# Patient Record
Sex: Female | Born: 1975
Health system: Southern US, Community
[De-identification: ages and names within clinical notes are randomized; demographics above are authoritative.]

## PROBLEM LIST (undated history)

## (undated) DIAGNOSIS — A749 Chlamydial infection, unspecified: Secondary | ICD-10-CM

## (undated) HISTORY — DX: Chlamydial infection, unspecified: A74.9

---

## 2006-08-01 ENCOUNTER — Inpatient Hospital Stay (HOSPITAL_COMMUNITY): Admission: AD | Admit: 2006-08-01 | Discharge: 2006-08-01 | Payer: Self-pay | Admitting: Obstetrics and Gynecology

## 2006-08-03 ENCOUNTER — Inpatient Hospital Stay (HOSPITAL_COMMUNITY): Admission: AD | Admit: 2006-08-03 | Discharge: 2006-08-05 | Payer: Self-pay | Admitting: Obstetrics and Gynecology

## 2010-03-29 DIAGNOSIS — A749 Chlamydial infection, unspecified: Secondary | ICD-10-CM

## 2010-03-29 HISTORY — DX: Chlamydial infection, unspecified: A74.9

## 2010-05-08 ENCOUNTER — Other Ambulatory Visit: Admission: RE | Admit: 2010-05-08 | Discharge: 2010-05-08 | Payer: Self-pay | Admitting: Gynecology

## 2010-05-08 ENCOUNTER — Ambulatory Visit: Payer: Self-pay | Admitting: Women's Health

## 2010-05-20 ENCOUNTER — Ambulatory Visit: Payer: Self-pay | Admitting: Women's Health

## 2011-05-12 ENCOUNTER — Encounter: Payer: Self-pay | Admitting: Women's Health

## 2011-05-29 ENCOUNTER — Other Ambulatory Visit (HOSPITAL_COMMUNITY)
Admission: RE | Admit: 2011-05-29 | Discharge: 2011-05-29 | Disposition: A | Discharge status: Home / Self Care | Payer: PRIVATE HEALTH INSURANCE | Source: Ambulatory Visit | Attending: Gynecology | Admitting: Gynecology

## 2011-05-29 ENCOUNTER — Encounter (INDEPENDENT_AMBULATORY_CARE_PROVIDER_SITE_OTHER): Payer: PRIVATE HEALTH INSURANCE | Admitting: Women's Health

## 2011-05-29 ENCOUNTER — Other Ambulatory Visit: Payer: Self-pay | Admitting: Women's Health

## 2011-05-29 DIAGNOSIS — Z833 Family history of diabetes mellitus: Secondary | ICD-10-CM

## 2011-05-29 DIAGNOSIS — Z124 Encounter for screening for malignant neoplasm of cervix: Secondary | ICD-10-CM | POA: Insufficient documentation

## 2011-05-29 DIAGNOSIS — Z01419 Encounter for gynecological examination (general) (routine) without abnormal findings: Secondary | ICD-10-CM

## 2012-05-31 ENCOUNTER — Encounter: Payer: Self-pay | Admitting: Women's Health

## 2012-05-31 ENCOUNTER — Ambulatory Visit (INDEPENDENT_AMBULATORY_CARE_PROVIDER_SITE_OTHER): Payer: PRIVATE HEALTH INSURANCE | Admitting: Women's Health

## 2012-05-31 VITALS — BP 100/70 | Ht 62.75 in | Wt 110.0 lb

## 2012-05-31 DIAGNOSIS — Z01419 Encounter for gynecological examination (general) (routine) without abnormal findings: Secondary | ICD-10-CM

## 2012-05-31 NOTE — Progress Notes (Signed)
Teresa Wheeler 22-Sep-1963 [id number]    History:    The patient presents for annual exam.  Monthly 3 day cycle not sexually active/ widow. History of normal Paps.  Past medical history, past surgical history, family history and social history were all reviewed and documented in the EPIC chart. Daughter [name] ,6 doing well. 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 History of positive chlamydia in 2011 while married.   ROS:  A  ROS was performed and pertinent positives and negatives are included in the history.  Exam:  Filed Vitals:   05/31/12 0928  BP: 100/70    General appearance:  Normal Head/Neck:  Normal, without cervical or supraclavicular adenopathy. Thyroid:  Symmetrical, normal in size, without palpable masses or nodularity. Respiratory  Effort:  Normal  Auscultation:  Clear without wheezing or rhonchi Cardiovascular  Auscultation:  Regular rate, without rubs, murmurs or gallops  Edema/varicosities:  Not grossly evident Abdominal  Soft,nontender, without masses, guarding or rebound.  Liver/spleen:  No organomegaly noted  Hernia:  None appreciated  Skin  Inspection:  Grossly normal  Palpation:  Grossly normal Neurologic/psychiatric  Orientation:  Normal with appropriate conversation.  Mood/affect:  Normal  Genitourinary    Breasts: Examined lying and sitting.     Right: Without masses, retractions, discharge or axillary adenopathy.     Left: Without masses, retractions, discharge or axillary adenopathy.   Inguinal/mons:  Normal without inguinal adenopathy  External genitalia:  Normal  BUS/Urethra/Skene's glands:  Normal  Bladder:  Normal  Vagina:  Normal  Cervix:  Normal  Uterus:   normal in size, shape and contour.  Midline and mobile  Adnexa/parametria:     Rt: Without masses or tenderness.   Lt: Without masses or tenderness.  Anus and perineum: Normal  Digital rectal exam: Normal sphincter tone without palpated masses or tenderness  Assessment/Plan:  36 y.o. WAF  G1P1 for annual exam with no complaints.  Normal GYN exam  Plan: CBC, UA. No Pap done, history of normal Paps, new guidelines and Pap screening recommendations reviewed. SBE's, exercise, calcium rich diet, MVI daily encouraged. Has a healthy lifestyle and will continue. Encouraged condoms if becomes sexually active or return to office for contraception.      Harrington Challenger [facility], 12:16 PM 05/31/2012

## 2012-05-31 NOTE — Patient Instructions (Signed)

## 2012-06-01 LAB — URINALYSIS W MICROSCOPIC + REFLEX CULTURE
Bacteria, UA: NONE SEEN
Casts: NONE SEEN
Glucose, UA: NEGATIVE mg/dL
Hgb urine dipstick: NEGATIVE
Ketones, ur: NEGATIVE mg/dL
Nitrite: NEGATIVE
pH: 6 (ref 5.0–8.0)

## 2012-06-01 LAB — CBC WITH DIFFERENTIAL/PLATELET
Eosinophils Absolute: 0.2 10*3/uL (ref 0.0–0.7)
Eosinophils Relative: 3 % (ref 0–5)
Lymphs Abs: 2.4 10*3/uL (ref 0.7–4.0)
MCH: 29.6 pg (ref 26.0–34.0)
MCHC: 33.5 g/dL (ref 30.0–36.0)
MCV: 88.5 fL (ref 78.0–100.0)
Platelets: 363 10*3/uL (ref 150–400)
RBC: 4.52 MIL/uL (ref 3.87–5.11)

## 2013-06-02 ENCOUNTER — Encounter: Payer: Self-pay | Admitting: Women's Health

## 2013-06-02 ENCOUNTER — Other Ambulatory Visit (HOSPITAL_COMMUNITY)
Admission: RE | Admit: 2013-06-02 | Discharge: 2013-06-02 | Disposition: A | Discharge status: Home / Self Care | Payer: PRIVATE HEALTH INSURANCE | Source: Ambulatory Visit | Attending: Gynecology | Admitting: Gynecology

## 2013-06-02 ENCOUNTER — Ambulatory Visit (INDEPENDENT_AMBULATORY_CARE_PROVIDER_SITE_OTHER): Payer: BC Managed Care – PPO | Admitting: Women's Health

## 2013-06-02 VITALS — BP 112/68 | Ht 63.0 in | Wt 107.0 lb

## 2013-06-02 DIAGNOSIS — Z01419 Encounter for gynecological examination (general) (routine) without abnormal findings: Secondary | ICD-10-CM | POA: Insufficient documentation

## 2013-06-02 DIAGNOSIS — N841 Polyp of cervix uteri: Secondary | ICD-10-CM

## 2013-06-02 NOTE — Patient Instructions (Addendum)
Vit D 1000 daily  (Nature made vitamin) Health Maintenance, Females A healthy lifestyle and preventative care can promote health and wellness.  Maintain regular health, dental, and eye exams.  Eat a healthy diet. Foods like vegetables, fruits, whole grains, low-fat dairy products, and lean protein foods contain the nutrients you need without too many calories. Decrease your intake of foods high in solid fats, added sugars, and salt. Get information about a proper diet from your caregiver, if necessary.  Regular physical exercise is one of the most important things you can do for your health. Most adults should get at least 150 minutes of moderate-intensity exercise (any activity that increases your heart rate and causes you to sweat) each week. In addition, most adults need muscle-strengthening exercises on 2 or more days a week.   Maintain a healthy weight. The body mass index (BMI) is a screening tool to identify possible weight problems. It provides an estimate of body fat based on height and weight. Your caregiver can help determine your BMI, and can help you achieve or maintain a healthy weight. For adults 20 years and older:  A BMI below 18.5 is considered underweight.  A BMI of 18.5 to 24.9 is normal.  A BMI of 25 to 29.9 is considered overweight.  A BMI of 30 and above is considered obese.  Maintain normal blood lipids and cholesterol by exercising and minimizing your intake of saturated fat. Eat a balanced diet with plenty of fruits and vegetables. Blood tests for lipids and cholesterol should begin at age 67 and be repeated every 5 years. If your lipid or cholesterol levels are high, you are over 50, or you are a high risk for heart disease, you may need your cholesterol levels checked more frequently.Ongoing high lipid and cholesterol levels should be treated with medicines if diet and exercise are not effective.  If you smoke, find out from your caregiver how to quit. If you do  not use tobacco, do not start.  If you are pregnant, do not drink alcohol. If you are breastfeeding, be very cautious about drinking alcohol. If you are not pregnant and choose to drink alcohol, do not exceed 1 drink per day. One drink is considered to be 12 ounces (355 mL) of beer, 5 ounces (148 mL) of wine, or 1.5 ounces (44 mL) of liquor.  Avoid use of street drugs. Do not share needles with anyone. Ask for help if you need support or instructions about stopping the use of drugs.  High blood pressure causes heart disease and increases the risk of stroke. Blood pressure should be checked at least every 1 to 2 years. Ongoing high blood pressure should be treated with medicines, if weight loss and exercise are not effective.  If you are 75 to 38 years old, ask your caregiver if you should take aspirin to prevent strokes.  Diabetes screening involves taking a blood sample to check your fasting blood sugar level. This should be done once every 3 years, after age 20, if you are within normal weight and without risk factors for diabetes. Testing should be considered at a younger age or be carried out more frequently if you are overweight and have at least 1 risk factor for diabetes.  Breast cancer screening is essential preventative care for women. You should practice "breast self-awareness." This means understanding the normal appearance and feel of your breasts and may include breast self-examination. Any changes detected, no matter how small, should be reported to a caregiver.  Women in their 1s and 30s should have a clinical breast exam (CBE) by a caregiver as part of a regular health exam every 1 to 3 years. After age 13, women should have a CBE every year. Starting at age 28, women should consider having a mammogram (breast X-ray) every year. Women who have a family history of breast cancer should talk to their caregiver about genetic screening. Women at a high risk of breast cancer should talk to  their caregiver about having an MRI and a mammogram every year.  The Pap test is a screening test for cervical cancer. Women should have a Pap test starting at age 52. Between ages 47 and 16, Pap tests should be repeated every 2 years. Beginning at age 60, you should have a Pap test every 3 years as long as the past 3 Pap tests have been normal. If you had a hysterectomy for a problem that was not cancer or a condition that could lead to cancer, then you no longer need Pap tests. If you are between ages 12 and 21, and you have had normal Pap tests going back 10 years, you no longer need Pap tests. If you have had past treatment for cervical cancer or a condition that could lead to cancer, you need Pap tests and screening for cancer for at least 20 years after your treatment. If Pap tests have been discontinued, risk factors (such as a new sexual partner) need to be reassessed to determine if screening should be resumed. Some women have medical problems that increase the chance of getting cervical cancer. In these cases, your caregiver may recommend more frequent screening and Pap tests.  The human papillomavirus (HPV) test is an additional test that may be used for cervical cancer screening. The HPV test looks for the virus that can cause the cell changes on the cervix. The cells collected during the Pap test can be tested for HPV. The HPV test could be used to screen women aged 6 years and older, and should be used in women of any age who have unclear Pap test results. After the age of 51, women should have HPV testing at the same frequency as a Pap test.  Colorectal cancer can be detected and often prevented. Most routine colorectal cancer screening begins at the age of 90 and continues through age 27. However, your caregiver may recommend screening at an earlier age if you have risk factors for colon cancer. On a yearly basis, your caregiver may provide home test kits to check for hidden blood in the  stool. Use of a small camera at the end of a tube, to directly examine the colon (sigmoidoscopy or colonoscopy), can detect the earliest forms of colorectal cancer. Talk to your caregiver about this at age 65, when routine screening begins. Direct examination of the colon should be repeated every 5 to 10 years through age 45, unless early forms of pre-cancerous polyps or small growths are found.  Hepatitis C blood testing is recommended for all people born from [birth year] through 1966 and any individual with known risks for hepatitis C.  Practice safe sex. Use condoms and avoid high-risk sexual practices to reduce the spread of sexually transmitted infections (STIs). Sexually active women aged 54 and younger should be checked for Chlamydia, which is a common sexually transmitted infection. Older women with new or multiple partners should also be tested for Chlamydia. Testing for other STIs is recommended if you are sexually active and at increased risk.  Osteoporosis is a disease in which the bones lose minerals and strength with aging. This can result in serious bone fractures. The risk of osteoporosis can be identified using a bone density scan. Women ages 40 and over and women at risk for fractures or osteoporosis should discuss screening with their caregivers. Ask your caregiver whether you should be taking a calcium supplement or vitamin D to reduce the rate of osteoporosis.  Menopause can be associated with physical symptoms and risks. Hormone replacement therapy is available to decrease symptoms and risks. You should talk to your caregiver about whether hormone replacement therapy is right for you.  Use sunscreen with a sun protection factor (SPF) of 30 or greater. Apply sunscreen liberally and repeatedly throughout the day. You should seek shade when your shadow is shorter than you. Protect yourself by wearing long sleeves, pants, a wide-brimmed hat, and sunglasses year round, whenever you are  outdoors.  Notify your caregiver of new moles or changes in moles, especially if there is a change in shape or color. Also notify your caregiver if a mole is larger than the size of a pencil eraser.  Stay current with your immunizations. Document Released: 06/30/2011 Document Revised: 03/08/2012 Document Reviewed: 06/30/2011 [facility] Patient Information 2014 [place], [place].

## 2013-06-02 NOTE — Addendum Note (Signed)
Addended by: Richardson Chiquito on: 06/02/2013 12:01 PM   Modules accepted: Orders

## 2013-06-02 NOTE — Progress Notes (Signed)
Teresa Wheeler 1976/02/08 [id number]    History:    The patient presents for annual exam.  Monthly cycle with no complaints, normal Pap history. Widow, not sexually active in years. Minimal language barrier.   Past medical history, past surgical history, family history and social history were all reviewed and documented in the EPIC chart. 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 [name] 6 doing well, leaving for visit to [place] for 10 weeks. Mother healthy, father's history unknown. Positive Chlamydia 2011 while married, treated had a negative test of cure.   ROS:  A  ROS was performed and pertinent positives and negatives are included in the history.  Exam:  Filed Vitals:   06/02/13 1037  BP: 112/68    General appearance:  Normal Head/Neck:  Normal, without cervical or supraclavicular adenopathy. Thyroid:  Symmetrical, normal in size, without palpable masses or nodularity. Respiratory  Effort:  Normal  Auscultation:  Clear without wheezing or rhonchi Cardiovascular  Auscultation:  Regular rate, without rubs, murmurs or gallops  Edema/varicosities:  Not grossly evident Abdominal  Soft,nontender, without masses, guarding or rebound.  Liver/spleen:  No organomegaly noted  Hernia:  None appreciated  Skin  Inspection:  Grossly normal  Palpation:  Grossly normal Neurologic/psychiatric  Orientation:  Normal with appropriate conversation.  Mood/affect:  Normal  Genitourinary    Breasts: Examined lying and sitting.     Right: Without masses, retractions, discharge or axillary adenopathy.     Left: Without masses, retractions, discharge or axillary adenopathy.   Inguinal/mons:  Normal without inguinal adenopathy  External genitalia:  Normal  BUS/Urethra/Skene's glands:  Normal  Bladder:  Normal  Vagina:  Normal  Cervix:  Normal half centimeter polyp removed with ease  Uterus:  normal in size, shape and contour.  Midline and mobile  Adnexa/parametria:      Rt: Without masses or tenderness.   Lt: Without masses or tenderness.  Anus and perineum: Normal  Digital rectal exam: Normal sphincter tone without palpated masses or tenderness  Assessment/Plan:  37 y.o. WAF G1P1  for annual exam with no complaints.  Cervical polyp  Plan: Cervical polyp sent for biopsy, instructed to call if any bleeding between cycles. SBE's, continue regular exercise, calcium rich diet, vitamin D 1000 daily encouraged. CBC, UA, Pap, Pap normal 2012, new screening guidelines reviewed. Contraception options reviewed declines, condoms encouraged if becomes sexually active.       Harrington Challenger [facility], 11:40 AM 06/02/2013

## 2013-06-03 LAB — URINALYSIS W MICROSCOPIC + REFLEX CULTURE
Hgb urine dipstick: NEGATIVE
Leukocytes, UA: NEGATIVE
Nitrite: NEGATIVE
Protein, ur: NEGATIVE mg/dL

## 2014-06-06 ENCOUNTER — Ambulatory Visit (INDEPENDENT_AMBULATORY_CARE_PROVIDER_SITE_OTHER): Payer: BC Managed Care – PPO | Admitting: Women's Health

## 2014-06-06 ENCOUNTER — Encounter: Payer: Self-pay | Admitting: Women's Health

## 2014-06-06 VITALS — BP 120/70 | Ht 62.75 in | Wt 117.8 lb

## 2014-06-06 DIAGNOSIS — Z13220 Encounter for screening for lipoid disorders: Secondary | ICD-10-CM

## 2014-06-06 DIAGNOSIS — Z01419 Encounter for gynecological examination (general) (routine) without abnormal findings: Secondary | ICD-10-CM

## 2014-06-06 LAB — CBC WITH DIFFERENTIAL/PLATELET
Basophils Absolute: 0 10*3/uL (ref 0.0–0.1)
Basophils Relative: 1 % (ref 0–1)
EOS ABS: 0.1 10*3/uL (ref 0.0–0.7)
Eosinophils Relative: 2 % (ref 0–5)
HCT: 38.5 % (ref 36.0–46.0)
Hemoglobin: 13.4 g/dL (ref 12.0–15.0)
LYMPHS PCT: 49 % — AB (ref 12–46)
Lymphs Abs: 2.1 10*3/uL (ref 0.7–4.0)
MCH: 29.6 pg (ref 26.0–34.0)
MCHC: 34.8 g/dL (ref 30.0–36.0)
MCV: 85.2 fL (ref 78.0–100.0)
Monocytes Absolute: 0.4 10*3/uL (ref 0.1–1.0)
Monocytes Relative: 9 % (ref 3–12)
Neutro Abs: 1.7 10*3/uL (ref 1.7–7.7)
Neutrophils Relative %: 39 % — ABNORMAL LOW (ref 43–77)
Platelets: 301 10*3/uL (ref 150–400)
RBC: 4.52 MIL/uL (ref 3.87–5.11)
RDW: 13.1 % (ref 11.5–15.5)
WBC: 4.3 10*3/uL (ref 4.0–10.5)

## 2014-06-06 LAB — LIPID PANEL
Cholesterol: 184 mg/dL (ref 0–200)
HDL: 61 mg/dL (ref >39)
LDL Cholesterol: 101 mg/dL — ABNORMAL HIGH (ref 0–99)
Total CHOL/HDL Ratio: 3 Ratio
Triglycerides: 112 mg/dL (ref <150)
VLDL: 22 mg/dL (ref 0–40)

## 2014-06-06 NOTE — Patient Instructions (Signed)

## 2014-06-06 NOTE — Progress Notes (Signed)
Teresa Wheeler February 14, 1976 [id number]    History:    Presents for annual exam. Light regular monthly cycle/not sexually active for 3 years. 2011 Chlamydia from husband who is now deceased. Pap 2014 negative.  Minimal language barrier.  Past medical history, past surgical history, family history and social history were all reviewed and documented in the EPIC chart. Works in [employer]. Daughter [name] 7 doing well 3rd grade.  ROS:  A  12 point ROS was performed and pertinent positives and negatives are included.  Exam:  Filed Vitals:   06/06/14 1017  BP: 120/70    General appearance:  Normal Thyroid:  Symmetrical, normal in size, without palpable masses or nodularity. Respiratory  Auscultation:  Clear without wheezing or rhonchi Cardiovascular  Auscultation:  Regular rate, without rubs, murmurs or gallops  Edema/varicosities:  Not grossly evident Abdominal  Soft,nontender, without masses, guarding or rebound.  Liver/spleen:  No organomegaly noted  Hernia:  None appreciated  Skin  Inspection:  Grossly normal   Breasts: Examined lying and sitting.     Right: Without masses, retractions, discharge or axillary adenopathy.     Left: Without masses, retractions, discharge or axillary adenopathy. Gentitourinary   Inguinal/mons:  Normal without inguinal adenopathy  External genitalia:  Normal  BUS/Urethra/Skene's glands:  Normal  Vagina:  Normal  Cervix:  Normal  Uterus:  anteverted, normal in size, shape and contour.  Midline and mobile  Adnexa/parametria:     Rt: Without masses or tenderness.   Lt: Without masses or tenderness.  Anus and perineum: Normal  Digital rectal exam: Normal sphincter tone without palpated masses or tenderness  Assessment/Plan:  38 y.o. WAF G1P1 for annual exam. No complaints.  Normal GYN exam/not sexually active  1. Health Maintenance: CBC, lipid panel, UA, SBE's, healthy diet and exercise reviewed. Pap 2014 negative, new screening guidelines reviewed.  Condoms encouraged if sexually active.    Note: This dictation was prepared with Dragon/digital dictation.  Any transcriptional errors that result are unintentional. Harrington Challenger [facility], 10:46 AM 06/06/2014

## 2014-06-07 LAB — URINALYSIS W MICROSCOPIC + REFLEX CULTURE
Bilirubin Urine: NEGATIVE
CASTS: NONE SEEN
Crystals: NONE SEEN
Glucose, UA: NEGATIVE mg/dL
Ketones, ur: NEGATIVE mg/dL
Nitrite: NEGATIVE
PH: 6.5 (ref 5.0–8.0)
Protein, ur: NEGATIVE mg/dL
Specific Gravity, Urine: 1.007 (ref 1.005–1.030)
Urobilinogen, UA: 0.2 mg/dL (ref 0.0–1.0)

## 2014-06-09 ENCOUNTER — Other Ambulatory Visit: Payer: Self-pay | Admitting: Women's Health

## 2014-06-09 MED ORDER — CIPROFLOXACIN HCL 250 MG PO TABS: 250.0000 mg | ORAL_TABLET | Freq: Two times a day (BID) | ORAL | Status: DC

## 2014-06-10 LAB — URINE CULTURE: Colony Count: >=100000

## 2014-10-30 ENCOUNTER — Encounter: Payer: Self-pay | Admitting: Women's Health

## 2015-06-08 ENCOUNTER — Encounter: Payer: Self-pay | Admitting: Women's Health

## 2015-06-08 ENCOUNTER — Ambulatory Visit (INDEPENDENT_AMBULATORY_CARE_PROVIDER_SITE_OTHER): Payer: BLUE CROSS/BLUE SHIELD | Admitting: Women's Health

## 2015-06-08 VITALS — BP 122/75 | Ht 63.0 in | Wt 117.4 lb

## 2015-06-08 DIAGNOSIS — Z01419 Encounter for gynecological examination (general) (routine) without abnormal findings: Secondary | ICD-10-CM | POA: Diagnosis not present

## 2015-06-08 NOTE — Progress Notes (Signed)
Makaiyla Umhoefer [birth year] [id number]    History:    Presents for annual exam.  Regular monthly cycle/not sexually active. Normal Pap history.  Past medical history, past surgical history, family history and social history were all reviewed and documented in the EPIC chart. Works in a school English as a second Counsellor. From [place], leaving for a visit for 2 months. Reports parents as healthy  ROS:  A ROS was performed and pertinent positives and negatives are included.  Exam:  Filed Vitals:   06/08/15 0853  BP: 122/75    General appearance:  Normal Thyroid:  Symmetrical, normal in size, without palpable masses or nodularity. Respiratory  Auscultation:  Clear without wheezing or rhonchi Cardiovascular  Auscultation:  Regular rate, without rubs, murmurs or gallops  Edema/varicosities:  Not grossly evident Abdominal  Soft,nontender, without masses, guarding or rebound.  Liver/spleen:  No organomegaly noted  Hernia:  None appreciated  Skin  Inspection:  Grossly normal   Breasts: Examined lying and sitting.     Right: Without masses, retractions, discharge or axillary adenopathy.     Left: Without masses, retractions, discharge or axillary adenopathy. Gentitourinary   Inguinal/mons:  Normal without inguinal adenopathy  External genitalia:  Normal  BUS/Urethra/Skene's glands:  Normal  Vagina:  Normal  Cervix:  Normal  Uterus:  normal in size, shape and contour.  Midline and mobile  Adnexa/parametria:     Rt: Without masses or tenderness.   Lt: Without masses or tenderness.  Anus and perineum: Normal  Digital rectal exam: Normal sphincter tone without palpated masses or tenderness  Assessment/Plan:  39 y.o. WAF G1 P1 for annual exam with no complaints.  Monthly cycle/not sexually active  Plan: Contraception reviewed and declined. Encourage condoms if sexually active. SBE's, annual screening mammogram at 40. Regular exercise, calcium rich diet, vitamin D 1000 daily  encouraged. CBC, glucose, UA, Pap normal 2014, new screening guidelines reviewed.    Harrington Challenger [facility], 9:21 AM 06/08/2015

## 2015-06-08 NOTE — Patient Instructions (Signed)
Vit D 1000 daily Walk or play daily with [facility] Maintenance Adopting a healthy lifestyle and getting preventive care can go a long way to promote health and wellness. Talk with your health care provider about what schedule of regular examinations is right for you. This is a good chance for you to check in with your provider about disease prevention and staying healthy. In between checkups, there are plenty of things you can do on your own. Experts have done a lot of research about which lifestyle changes and preventive measures are most likely to keep you healthy. Ask your health care provider for more information. WEIGHT AND DIET  Eat a healthy diet  Be sure to include plenty of vegetables, fruits, low-fat dairy products, and lean protein.  Do not eat a lot of foods high in solid fats, added sugars, or salt.  Get regular exercise. This is one of the most important things you can do for your health.  Most adults should exercise for at least 150 minutes each week. The exercise should increase your heart rate and make you sweat (moderate-intensity exercise).  Most adults should also do strengthening exercises at least twice a week. This is in addition to the moderate-intensity exercise.  Maintain a healthy weight  Body mass index (BMI) is a measurement that can be used to identify possible weight problems. It estimates body fat based on height and weight. Your health care provider can help determine your BMI and help you achieve or maintain a healthy weight.  For females 83 years of age and older:   A BMI below 18.5 is considered underweight.  A BMI of 18.5 to 24.9 is normal.  A BMI of 25 to 29.9 is considered overweight.  A BMI of 30 and above is considered obese.  Watch levels of cholesterol and blood lipids  You should start having your blood tested for lipids and cholesterol at 40 years of age, then have this test every 5 years.  You may need to have your cholesterol  levels checked more often if:  Your lipid or cholesterol levels are high.  You are older than 40 years of age.  You are at high risk for heart disease.  CANCER SCREENING   Lung Cancer  Lung cancer screening is recommended for adults 38-50 years old who are at high risk for lung cancer because of a history of smoking.  A yearly low-dose CT scan of the lungs is recommended for people who:  Currently smoke.  Have quit within the past 15 years.  Have at least a 30-pack-year history of smoking. A pack year is smoking an average of one pack of cigarettes a day for 1 year.  Yearly screening should continue until it has been 15 years since you quit.  Yearly screening should stop if you develop a health problem that would prevent you from having lung cancer treatment.  Breast Cancer  Practice breast self-awareness. This means understanding how your breasts normally appear and feel.  It also means doing regular breast self-exams. Let your health care provider know about any changes, no matter how small.  If you are in your 20s or 30s, you should have a clinical breast exam (CBE) by a health care provider every 1-3 years as part of a regular health exam.  If you are 28 or older, have a CBE every year. Also consider having a breast X-ray (mammogram) every year.  If you have a family history of breast cancer, talk to  your health care provider about genetic screening.  If you are at high risk for breast cancer, talk to your health care provider about having an MRI and a mammogram every year.  Breast cancer gene (BRCA) assessment is recommended for women who have family members with BRCA-related cancers. BRCA-related cancers include:  Breast.  Ovarian.  Tubal.  Peritoneal cancers.  Results of the assessment will determine the need for genetic counseling and BRCA1 and BRCA2 testing. Cervical Cancer Routine pelvic examinations to screen for cervical cancer are no longer  recommended for nonpregnant women who are considered low risk for cancer of the pelvic organs (ovaries, uterus, and vagina) and who do not have symptoms. A pelvic examination may be necessary if you have symptoms including those associated with pelvic infections. Ask your health care provider if a screening pelvic exam is right for you.   The Pap test is the screening test for cervical cancer for women who are considered at risk.  If you had a hysterectomy for a problem that was not cancer or a condition that could lead to cancer, then you no longer need Pap tests.  If you are older than 65 years, and you have had normal Pap tests for the past 10 years, you no longer need to have Pap tests.  If you have had past treatment for cervical cancer or a condition that could lead to cancer, you need Pap tests and screening for cancer for at least 20 years after your treatment.  If you no longer get a Pap test, assess your risk factors if they change (such as having a new sexual partner). This can affect whether you should start being screened again.  Some women have medical problems that increase their chance of getting cervical cancer. If this is the case for you, your health care provider may recommend more frequent screening and Pap tests.  The human papillomavirus (HPV) test is another test that may be used for cervical cancer screening. The HPV test looks for the virus that can cause cell changes in the cervix. The cells collected during the Pap test can be tested for HPV.  The HPV test can be used to screen women 69 years of age and older. Getting tested for HPV can extend the interval between normal Pap tests from three to five years.  An HPV test also should be used to screen women of any age who have unclear Pap test results.  After 40 years of age, women should have HPV testing as often as Pap tests.  Colorectal Cancer  This type of cancer can be detected and often prevented.  Routine  colorectal cancer screening usually begins at 40 years of age and continues through 40 years of age.  Your health care provider may recommend screening at an earlier age if you have risk factors for colon cancer.  Your health care provider may also recommend using home test kits to check for hidden blood in the stool.  A small camera at the end of a tube can be used to examine your colon directly (sigmoidoscopy or colonoscopy). This is done to check for the earliest forms of colorectal cancer.  Routine screening usually begins at age 37.  Direct examination of the colon should be repeated every 5-10 years through 40 years of age. However, you may need to be screened more often if early forms of precancerous polyps or small growths are found. Skin Cancer  Check your skin from head to toe regularly.  Tell  your health care provider about any new moles or changes in moles, especially if there is a change in a mole's shape or color.  Also tell your health care provider if you have a mole that is larger than the size of a pencil eraser.  Always use sunscreen. Apply sunscreen liberally and repeatedly throughout the day.  Protect yourself by wearing long sleeves, pants, a wide-brimmed hat, and sunglasses whenever you are outside. HEART DISEASE, DIABETES, AND HIGH BLOOD PRESSURE   Have your blood pressure checked at least every 1-2 years. High blood pressure causes heart disease and increases the risk of stroke.  If you are between 38 years and 10 years old, ask your health care provider if you should take aspirin to prevent strokes.  Have regular diabetes screenings. This involves taking a blood sample to check your fasting blood sugar level.  If you are at a normal weight and have a low risk for diabetes, have this test once every three years after 40 years of age.  If you are overweight and have a high risk for diabetes, consider being tested at a younger age or more often. PREVENTING  INFECTION  Hepatitis B  If you have a higher risk for hepatitis B, you should be screened for this virus. You are considered at high risk for hepatitis B if:  You were born in a country where hepatitis B is common. Ask your health care provider which countries are considered high risk.  Your parents were born in a high-risk country, and you have not been immunized against hepatitis B (hepatitis B vaccine).  You have HIV or AIDS.  You use needles to inject street drugs.  You live with someone who has hepatitis B.  You have had sex with someone who has hepatitis B.  You get hemodialysis treatment.  You take certain medicines for conditions, including cancer, organ transplantation, and autoimmune conditions. Hepatitis C  Blood testing is recommended for:  Everyone born from [birth year] through 1966.  Anyone with known risk factors for hepatitis C. Sexually transmitted infections (STIs)  You should be screened for sexually transmitted infections (STIs) including gonorrhea and chlamydia if:  You are sexually active and are younger than 40 years of age.  You are older than 40 years of age and your health care provider tells you that you are at risk for this type of infection.  Your sexual activity has changed since you were last screened and you are at an increased risk for chlamydia or gonorrhea. Ask your health care provider if you are at risk.  If you do not have HIV, but are at risk, it may be recommended that you take a prescription medicine daily to prevent HIV infection. This is called pre-exposure prophylaxis (PrEP). You are considered at risk if:  You are sexually active and do not regularly use condoms or know the HIV status of your partner(s).  You take drugs by injection.  You are sexually active with a partner who has HIV. Talk with your health care provider about whether you are at high risk of being infected with HIV. If you choose to begin PrEP, you should first be  tested for HIV. You should then be tested every 3 months for as long as you are taking PrEP.  PREGNANCY   If you are premenopausal and you may become pregnant, ask your health care provider about preconception counseling.  If you may become pregnant, take 400 to 800 micrograms (mcg) of folic acid every  day.  If you want to prevent pregnancy, talk to your health care provider about birth control (contraception). OSTEOPOROSIS AND MENOPAUSE   Osteoporosis is a disease in which the bones lose minerals and strength with aging. This can result in serious bone fractures. Your risk for osteoporosis can be identified using a bone density scan.  If you are 17 years of age or older, or if you are at risk for osteoporosis and fractures, ask your health care provider if you should be screened.  Ask your health care provider whether you should take a calcium or vitamin D supplement to lower your risk for osteoporosis.  Menopause may have certain physical symptoms and risks.  Hormone replacement therapy may reduce some of these symptoms and risks. Talk to your health care provider about whether hormone replacement therapy is right for you.  HOME CARE INSTRUCTIONS   Schedule regular health, dental, and eye exams.  Stay current with your immunizations.   Do not use any tobacco products including cigarettes, chewing tobacco, or electronic cigarettes.  If you are pregnant, do not drink alcohol.  If you are breastfeeding, limit how much and how often you drink alcohol.  Limit alcohol intake to no more than 1 drink per day for nonpregnant women. One drink equals 12 ounces of beer, 5 ounces of wine, or 1 ounces of hard liquor.  Do not use street drugs.  Do not share needles.  Ask your health care provider for help if you need support or information about quitting drugs.  Tell your health care provider if you often feel depressed.  Tell your health care provider if you have ever been abused or  do not feel safe at home. Document Released: 06/30/2011 Document Revised: 05/01/2014 Document Reviewed: 11/16/2013 [facility] Patient Information 2015 [place], [place]. This information is not intended to replace advice given to you by your health care provider. Make sure you discuss any questions you have with your health care provider.  Everyday with Alma Friendly

## 2015-06-09 LAB — URINALYSIS W MICROSCOPIC + REFLEX CULTURE
Bacteria, UA: NONE SEEN
Bilirubin Urine: NEGATIVE
CASTS: NONE SEEN
Crystals: NONE SEEN
Glucose, UA: NEGATIVE mg/dL
Hgb urine dipstick: NEGATIVE
KETONES UR: NEGATIVE mg/dL
LEUKOCYTES UA: NEGATIVE
NITRITE: NEGATIVE
Protein, ur: NEGATIVE mg/dL
SQUAMOUS EPITHELIAL / LPF: NONE SEEN
Specific Gravity, Urine: <1.005 — ABNORMAL LOW (ref 1.005–1.030)
UROBILINOGEN UA: 0.2 mg/dL (ref 0.0–1.0)
pH: 6 (ref 5.0–8.0)

## 2016-04-21 DIAGNOSIS — H1013 Acute atopic conjunctivitis, bilateral: Secondary | ICD-10-CM | POA: Diagnosis not present

## 2016-04-21 DIAGNOSIS — J301 Allergic rhinitis due to pollen: Secondary | ICD-10-CM | POA: Diagnosis not present

## 2016-06-24 ENCOUNTER — Ambulatory Visit (INDEPENDENT_AMBULATORY_CARE_PROVIDER_SITE_OTHER): Payer: BLUE CROSS/BLUE SHIELD | Admitting: Women's Health

## 2016-06-24 ENCOUNTER — Encounter: Payer: Self-pay | Admitting: Women's Health

## 2016-06-24 VITALS — BP 122/80 | Ht 63.0 in | Wt 121.0 lb

## 2016-06-24 DIAGNOSIS — Z13220 Encounter for screening for lipoid disorders: Secondary | ICD-10-CM

## 2016-06-24 DIAGNOSIS — N946 Dysmenorrhea, unspecified: Secondary | ICD-10-CM | POA: Diagnosis not present

## 2016-06-24 DIAGNOSIS — Z833 Family history of diabetes mellitus: Secondary | ICD-10-CM | POA: Diagnosis not present

## 2016-06-24 DIAGNOSIS — Z01419 Encounter for gynecological examination (general) (routine) without abnormal findings: Secondary | ICD-10-CM

## 2016-06-24 LAB — CBC WITH DIFFERENTIAL/PLATELET
BASOS PCT: 0 %
Basophils Absolute: 0 cells/uL (ref 0–200)
Eosinophils Absolute: 104 cells/uL (ref 15–500)
Eosinophils Relative: 2 %
HEMATOCRIT: 39 % (ref 35.0–45.0)
Hemoglobin: 13.1 g/dL (ref 11.7–15.5)
LYMPHS ABS: 2288 {cells}/uL (ref 850–3900)
LYMPHS PCT: 44 %
MCH: 29.8 pg (ref 27.0–33.0)
MCHC: 33.6 g/dL (ref 32.0–36.0)
MCV: 88.8 fL (ref 80.0–100.0)
MONO ABS: 520 {cells}/uL (ref 200–950)
MONOS PCT: 10 %
MPV: 9.8 fL (ref 7.5–12.5)
NEUTROS ABS: 2288 {cells}/uL (ref 1500–7800)
NEUTROS PCT: 44 %
PLATELETS: 293 10*3/uL (ref 140–400)
RBC: 4.39 MIL/uL (ref 3.80–5.10)
RDW: 12.5 % (ref 11.0–15.0)
WBC: 5.2 10*3/uL (ref 3.8–10.8)

## 2016-06-24 LAB — URINALYSIS W MICROSCOPIC + REFLEX CULTURE
BACTERIA UA: NONE SEEN [HPF]
Bilirubin Urine: NEGATIVE
CRYSTALS: NONE SEEN [HPF]
Casts: NONE SEEN [LPF]
Glucose, UA: NEGATIVE
HGB URINE DIPSTICK: NEGATIVE
Ketones, ur: NEGATIVE
Leukocytes, UA: NEGATIVE
Nitrite: NEGATIVE
PROTEIN: NEGATIVE
RBC / HPF: NONE SEEN RBC/HPF (ref <=2)
Specific Gravity, Urine: 1.005 (ref 1.001–1.035)
Squamous Epithelial / LPF: NONE SEEN [HPF] (ref <=5)
WBC, UA: NONE SEEN WBC/HPF (ref <=5)
YEAST: NONE SEEN [HPF]
pH: 6.5 (ref 5.0–8.0)

## 2016-06-24 MED ORDER — IBUPROFEN 600 MG PO TABS: 600.0000 mg | ORAL_TABLET | Freq: Three times a day (TID) | ORAL | Status: DC | PRN

## 2016-06-24 NOTE — Patient Instructions (Signed)
Breast center for mammogram  [phone]  Health Maintenance, Female Adopting a healthy lifestyle and getting preventive care can go a long way to promote health and wellness. Talk with your health care provider about what schedule of regular examinations is right for you. This is a good chance for you to check in with your provider about disease prevention and staying healthy. In between checkups, there are plenty of things you can do on your own. Experts have done a lot of research about which lifestyle changes and preventive measures are most likely to keep you healthy. Ask your health care provider for more information. WEIGHT AND DIET  Eat a healthy diet  Be sure to include plenty of vegetables, fruits, low-fat dairy products, and lean protein.  Do not eat a lot of foods high in solid fats, added sugars, or salt.  Get regular exercise. This is one of the most important things you can do for your health.  Most adults should exercise for at least 150 minutes each week. The exercise should increase your heart rate and make you sweat (moderate-intensity exercise).  Most adults should also do strengthening exercises at least twice a week. This is in addition to the moderate-intensity exercise.  Maintain a healthy weight  Body mass index (BMI) is a measurement that can be used to identify possible weight problems. It estimates body fat based on height and weight. Your health care provider can help determine your BMI and help you achieve or maintain a healthy weight.  For females 80 years of age and older:   A BMI below 18.5 is considered underweight.  A BMI of 18.5 to 24.9 is normal.  A BMI of 25 to 29.9 is considered overweight.  A BMI of 30 and above is considered obese.  Watch levels of cholesterol and blood lipids  You should start having your blood tested for lipids and cholesterol at 41 years of age, then have this test every 5 years.  You may need to have your cholesterol  levels checked more often if:  Your lipid or cholesterol levels are high.  You are older than 41 years of age.  You are at high risk for heart disease.  CANCER SCREENING   Lung Cancer  Lung cancer screening is recommended for adults 81-35 years old who are at high risk for lung cancer because of a history of smoking.  A yearly low-dose CT scan of the lungs is recommended for people who:  Currently smoke.  Have quit within the past 15 years.  Have at least a 30-pack-year history of smoking. A pack year is smoking an average of one pack of cigarettes a day for 1 year.  Yearly screening should continue until it has been 15 years since you quit.  Yearly screening should stop if you develop a health problem that would prevent you from having lung cancer treatment.  Breast Cancer  Practice breast self-awareness. This means understanding how your breasts normally appear and feel.  It also means doing regular breast self-exams. Let your health care provider know about any changes, no matter how small.  If you are in your 20s or 30s, you should have a clinical breast exam (CBE) by a health care provider every 1-3 years as part of a regular health exam.  If you are 27 or older, have a CBE every year. Also consider having a breast X-ray (mammogram) every year.  If you have a family history of breast cancer, talk to your health care  provider about genetic screening.  If you are at high risk for breast cancer, talk to your health care provider about having an MRI and a mammogram every year.  Breast cancer gene (BRCA) assessment is recommended for women who have family members with BRCA-related cancers. BRCA-related cancers include:  Breast.  Ovarian.  Tubal.  Peritoneal cancers.  Results of the assessment will determine the need for genetic counseling and BRCA1 and BRCA2 testing. Cervical Cancer Your health care provider may recommend that you be screened regularly for cancer  of the pelvic organs (ovaries, uterus, and vagina). This screening involves a pelvic examination, including checking for microscopic changes to the surface of your cervix (Pap test). You may be encouraged to have this screening done every 3 years, beginning at age 58.  For women ages 51-65, health care providers may recommend pelvic exams and Pap testing every 3 years, or they may recommend the Pap and pelvic exam, combined with testing for human papilloma virus (HPV), every 5 years. Some types of HPV increase your risk of cervical cancer. Testing for HPV may also be done on women of any age with unclear Pap test results.  Other health care providers may not recommend any screening for nonpregnant women who are considered low risk for pelvic cancer and who do not have symptoms. Ask your health care provider if a screening pelvic exam is right for you.  If you have had past treatment for cervical cancer or a condition that could lead to cancer, you need Pap tests and screening for cancer for at least 20 years after your treatment. If Pap tests have been discontinued, your risk factors (such as having a new sexual partner) need to be reassessed to determine if screening should resume. Some women have medical problems that increase the chance of getting cervical cancer. In these cases, your health care provider may recommend more frequent screening and Pap tests. Colorectal Cancer  This type of cancer can be detected and often prevented.  Routine colorectal cancer screening usually begins at 41 years of age and continues through 41 years of age.  Your health care provider may recommend screening at an earlier age if you have risk factors for colon cancer.  Your health care provider may also recommend using home test kits to check for hidden blood in the stool.  A small camera at the end of a tube can be used to examine your colon directly (sigmoidoscopy or colonoscopy). This is done to check for the  earliest forms of colorectal cancer.  Routine screening usually begins at age 69.  Direct examination of the colon should be repeated every 5-10 years through 41 years of age. However, you may need to be screened more often if early forms of precancerous polyps or small growths are found. Skin Cancer  Check your skin from head to toe regularly.  Tell your health care provider about any new moles or changes in moles, especially if there is a change in a mole's shape or color.  Also tell your health care provider if you have a mole that is larger than the size of a pencil eraser.  Always use sunscreen. Apply sunscreen liberally and repeatedly throughout the day.  Protect yourself by wearing long sleeves, pants, a wide-brimmed hat, and sunglasses whenever you are outside. HEART DISEASE, DIABETES, AND HIGH BLOOD PRESSURE   High blood pressure causes heart disease and increases the risk of stroke. High blood pressure is more likely to develop in:  People who  have blood pressure in the high end of the normal range (130-139/85-89 mm Hg).  People who are overweight or obese.  People who are African American.  If you are 64-58 years of age, have your blood pressure checked every 3-5 years. If you are 33 years of age or older, have your blood pressure checked every year. You should have your blood pressure measured twice--once when you are at a hospital or clinic, and once when you are not at a hospital or clinic. Record the average of the two measurements. To check your blood pressure when you are not at a hospital or clinic, you can use:  An automated blood pressure machine at a pharmacy.  A home blood pressure monitor.  If you are between 91 years and 38 years old, ask your health care provider if you should take aspirin to prevent strokes.  Have regular diabetes screenings. This involves taking a blood sample to check your fasting blood sugar level.  If you are at a normal weight and  have a low risk for diabetes, have this test once every three years after 41 years of age.  If you are overweight and have a high risk for diabetes, consider being tested at a younger age or more often. PREVENTING INFECTION  Hepatitis B  If you have a higher risk for hepatitis B, you should be screened for this virus. You are considered at high risk for hepatitis B if:  You were born in a country where hepatitis B is common. Ask your health care provider which countries are considered high risk.  Your parents were born in a high-risk country, and you have not been immunized against hepatitis B (hepatitis B vaccine).  You have HIV or AIDS.  You use needles to inject street drugs.  You live with someone who has hepatitis B.  You have had sex with someone who has hepatitis B.  You get hemodialysis treatment.  You take certain medicines for conditions, including cancer, organ transplantation, and autoimmune conditions. Hepatitis C  Blood testing is recommended for:  Everyone born from [birth year] through 1966.  Anyone with known risk factors for hepatitis C. Sexually transmitted infections (STIs)  You should be screened for sexually transmitted infections (STIs) including gonorrhea and chlamydia if:  You are sexually active and are younger than 41 years of age.  You are older than 41 years of age and your health care provider tells you that you are at risk for this type of infection.  Your sexual activity has changed since you were last screened and you are at an increased risk for chlamydia or gonorrhea. Ask your health care provider if you are at risk.  If you do not have HIV, but are at risk, it may be recommended that you take a prescription medicine daily to prevent HIV infection. This is called pre-exposure prophylaxis (PrEP). You are considered at risk if:  You are sexually active and do not regularly use condoms or know the HIV status of your partner(s).  You take drugs by  injection.  You are sexually active with a partner who has HIV. Talk with your health care provider about whether you are at high risk of being infected with HIV. If you choose to begin PrEP, you should first be tested for HIV. You should then be tested every 3 months for as long as you are taking PrEP.  PREGNANCY   If you are premenopausal and you may become pregnant, ask your health care provider  about preconception counseling.  If you may become pregnant, take 400 to 800 micrograms (mcg) of folic acid every day.  If you want to prevent pregnancy, talk to your health care provider about birth control (contraception). OSTEOPOROSIS AND MENOPAUSE   Osteoporosis is a disease in which the bones lose minerals and strength with aging. This can result in serious bone fractures. Your risk for osteoporosis can be identified using a bone density scan.  If you are 44 years of age or older, or if you are at risk for osteoporosis and fractures, ask your health care provider if you should be screened.  Ask your health care provider whether you should take a calcium or vitamin D supplement to lower your risk for osteoporosis.  Menopause may have certain physical symptoms and risks.  Hormone replacement therapy may reduce some of these symptoms and risks. Talk to your health care provider about whether hormone replacement therapy is right for you.  HOME CARE INSTRUCTIONS   Schedule regular health, dental, and eye exams.  Stay current with your immunizations.   Do not use any tobacco products including cigarettes, chewing tobacco, or electronic cigarettes.  If you are pregnant, do not drink alcohol.  If you are breastfeeding, limit how much and how often you drink alcohol.  Limit alcohol intake to no more than 1 drink per day for nonpregnant women. One drink equals 12 ounces of beer, 5 ounces of wine, or 1 ounces of hard liquor.  Do not use street drugs.  Do not share needles.  Ask your  health care provider for help if you need support or information about quitting drugs.  Tell your health care provider if you often feel depressed.  Tell your health care provider if you have ever been abused or do not feel safe at home.   This information is not intended to replace advice given to you by your health care provider. Make sure you discuss any questions you have with your health care provider.   Document Released: 06/30/2011 Document Revised: 01/05/2015 Document Reviewed: 11/16/2013 Elsevier Interactive Patient Education [employer].

## 2016-06-24 NOTE — Progress Notes (Signed)
Marcos EkeDang Foucher 04-Nov-1976 [id number]    History:    Presents for annual exam with no complaints.  Monthly 3 day cycles with some cramping and headaches, relief with Advil.    Pap history normal.  Reports seasonal allergies, relief of symptoms with Claritin.  Chlamydia 2011 from husband who has since passed away, treated, negative test of cure, no sexual activity since.  Past medical history, past surgical history, family history and social history were all reviewed and documented in the EPIC chart.  Widow.  One daughter, [id number].  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  Treadmill for 30 minutes 3x/week, Hula-Hoop.  Diet of meat and vegetables, rice 1x/day.  Parents in [place], will visit next year.    ROS:  A ROS was performed and pertinent positives and negatives are included.  Exam:  Filed Vitals:   06/24/16 0852  BP: 122/80    General appearance:  Normal, well-appearing Thyroid:  Symmetrical, normal in size, without palpable masses or nodularity. Respiratory  Auscultation:  Clear without wheezing or rhonchi Cardiovascular  Auscultation:  Regular rate, without rubs, murmurs or gallops  Edema/varicosities:  Not grossly evident Abdominal  Soft,nontender, without masses, guarding or rebound.  Liver/spleen:  No organomegaly noted  Hernia:  None appreciated  Skin  Inspection:  Grossly normal   Breasts: Examined lying and sitting.     Right: Without masses, retractions, discharge or axillary adenopathy.     Left: Without masses, retractions, discharge or axillary adenopathy. Gentitourinary   Inguinal/mons:  Normal without inguinal adenopathy  External genitalia:  Normal  BUS/Urethra/Skene's glands:  Normal  Vagina:  Normal  Cervix:  Normal  Uterus:  Normal in size, shape and contour.  Midline and mobile  Adnexa/parametria:     Rt: Without masses or tenderness.   Lt: Without masses or tenderness.  Anus and perineum: Normal  Digital rectal exam: Normal sphincter tone  without palpated masses or tenderness  Assessment/Plan:  40 y.o. WAF G1P1 for annual exam with no complaints.  Monthly cycles with occasional cramping, HAs  Plan: SBE's, information for breast center provided to schedule mammogram, screening guidelines reviewed.  Continue  healthy diet and regular exercise.  Motrin 600mg  prescription, proper use reviewed po q8hrs prn for cramps and headaches during monthly cycles.  Encouraged condoms if sexually active, denies need for other contraception.  CBC, CMP, lipid panel, UA, Pap with HR HPV typing, new screening guidelines reviewed.     Harrington ChallengerYOUNG,NANCY J WHNP, 9:30 AM 06/24/2016

## 2016-06-25 ENCOUNTER — Other Ambulatory Visit: Payer: Self-pay | Admitting: Family Medicine

## 2016-06-25 DIAGNOSIS — Z1231 Encounter for screening mammogram for malignant neoplasm of breast: Secondary | ICD-10-CM

## 2016-06-25 LAB — LIPID PANEL
CHOLESTEROL: 164 mg/dL (ref 125–200)
HDL: 61 mg/dL (ref >=46)
LDL CALC: 84 mg/dL (ref <130)
Total CHOL/HDL Ratio: 2.7 Ratio (ref <=5.0)
Triglycerides: 95 mg/dL (ref <150)
VLDL: 19 mg/dL (ref <30)

## 2016-06-25 LAB — PAP, TP IMAGING W/ HPV RNA, RFLX HPV TYPE 16,18/45: HPV mRNA, High Risk: NOT DETECTED

## 2016-06-25 LAB — GLUCOSE, RANDOM: Glucose, Bld: 84 mg/dL (ref 65–99)

## 2016-07-07 ENCOUNTER — Ambulatory Visit
Admission: RE | Admit: 2016-07-07 | Discharge: 2016-07-07 | Disposition: A | Discharge status: Home / Self Care | Payer: BLUE CROSS/BLUE SHIELD | Source: Ambulatory Visit | Attending: Family Medicine | Admitting: Family Medicine

## 2016-07-07 DIAGNOSIS — Z1231 Encounter for screening mammogram for malignant neoplasm of breast: Secondary | ICD-10-CM

## 2016-08-26 DIAGNOSIS — B372 Candidiasis of skin and nail: Secondary | ICD-10-CM | POA: Diagnosis not present

## 2016-08-28 DIAGNOSIS — L237 Allergic contact dermatitis due to plants, except food: Secondary | ICD-10-CM | POA: Diagnosis not present

## 2016-10-01 DIAGNOSIS — Z23 Encounter for immunization: Secondary | ICD-10-CM | POA: Diagnosis not present

## 2017-01-26 DIAGNOSIS — Z0000 Encounter for general adult medical examination without abnormal findings: Secondary | ICD-10-CM | POA: Diagnosis not present

## 2017-01-28 DIAGNOSIS — Z0000 Encounter for general adult medical examination without abnormal findings: Secondary | ICD-10-CM | POA: Diagnosis not present

## 2017-03-02 DIAGNOSIS — R05 Cough: Secondary | ICD-10-CM | POA: Diagnosis not present

## 2017-05-13 ENCOUNTER — Encounter: Payer: Self-pay | Admitting: Gynecology

## 2017-05-13 DIAGNOSIS — J301 Allergic rhinitis due to pollen: Secondary | ICD-10-CM | POA: Diagnosis not present

## 2017-06-01 ENCOUNTER — Other Ambulatory Visit: Payer: Self-pay | Admitting: Family Medicine

## 2017-06-01 DIAGNOSIS — Z1231 Encounter for screening mammogram for malignant neoplasm of breast: Secondary | ICD-10-CM

## 2017-09-01 ENCOUNTER — Encounter: Payer: BLUE CROSS/BLUE SHIELD | Admitting: Women's Health

## 2017-09-02 ENCOUNTER — Ambulatory Visit
Admission: RE | Admit: 2017-09-02 | Discharge: 2017-09-02 | Disposition: A | Discharge status: Home / Self Care | Payer: BLUE CROSS/BLUE SHIELD | Source: Ambulatory Visit | Attending: Family Medicine | Admitting: Family Medicine

## 2017-09-02 DIAGNOSIS — Z1231 Encounter for screening mammogram for malignant neoplasm of breast: Secondary | ICD-10-CM

## 2017-09-03 ENCOUNTER — Other Ambulatory Visit: Payer: Self-pay | Admitting: Family Medicine

## 2017-09-03 DIAGNOSIS — R928 Other abnormal and inconclusive findings on diagnostic imaging of breast: Secondary | ICD-10-CM

## 2017-09-08 ENCOUNTER — Encounter: Payer: BLUE CROSS/BLUE SHIELD | Admitting: Women's Health

## 2017-09-09 ENCOUNTER — Ambulatory Visit
Admission: RE | Admit: 2017-09-09 | Discharge: 2017-09-09 | Disposition: A | Discharge status: Home / Self Care | Payer: BLUE CROSS/BLUE SHIELD | Source: Ambulatory Visit | Attending: Family Medicine | Admitting: Family Medicine

## 2017-09-09 DIAGNOSIS — R928 Other abnormal and inconclusive findings on diagnostic imaging of breast: Secondary | ICD-10-CM

## 2017-09-09 DIAGNOSIS — N6489 Other specified disorders of breast: Secondary | ICD-10-CM | POA: Diagnosis not present

## 2017-09-09 DIAGNOSIS — R922 Inconclusive mammogram: Secondary | ICD-10-CM | POA: Diagnosis not present

## 2017-10-07 ENCOUNTER — Ambulatory Visit (INDEPENDENT_AMBULATORY_CARE_PROVIDER_SITE_OTHER): Payer: BLUE CROSS/BLUE SHIELD | Admitting: Women's Health

## 2017-10-07 ENCOUNTER — Encounter: Payer: Self-pay | Admitting: Women's Health

## 2017-10-07 VITALS — BP 120/80 | Ht 63.0 in | Wt 121.0 lb

## 2017-10-07 DIAGNOSIS — Z01419 Encounter for gynecological examination (general) (routine) without abnormal findings: Secondary | ICD-10-CM

## 2017-10-07 DIAGNOSIS — N841 Polyp of cervix uteri: Secondary | ICD-10-CM | POA: Diagnosis not present

## 2017-10-07 NOTE — Patient Instructions (Signed)

## 2017-10-07 NOTE — Progress Notes (Signed)
Teresa Wheeler 01-Sep-1976 [id number]    History:    Presents for annual exam.  Monthly 3 day cycle, not sexually active in years (widow). Normal Pap and mammogram history. Mammogram last month was normal after ultrasound.  Past medical history, past surgical history, family history and social history were all reviewed and documented in the EPIC chart. 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 Daughter is 73 doing well. Hula hoops for exercise. Visited home in [place] this past summer.  ROS:  A ROS was performed and pertinent positives and negatives are included.  Exam:  Vitals:   10/07/17 1112  BP: 120/80  Weight: 121 lb (54.9 kg)  Height:  (1.6 m)   Body mass index is 21.43 kg/m.   General appearance:  Normal Thyroid:  Symmetrical, normal in size, without palpable masses or nodularity. Respiratory  Auscultation:  Clear without wheezing or rhonchi Cardiovascular  Auscultation:  Regular rate, without rubs, murmurs or gallops  Edema/varicosities:  Not grossly evident Abdominal  Soft,nontender, without masses, guarding or rebound.  Liver/spleen:  No organomegaly noted  Hernia:  None appreciated  Skin  Inspection:  Grossly normal   Breasts: Examined lying and sitting.     Right: Without masses, retractions, discharge or axillary adenopathy.     Left: Without masses, retractions, discharge or axillary adenopathy. Gentitourinary   Inguinal/mons:  Normal without inguinal adenopathy  External genitalia:  Normal  BUS/Urethra/Skene's glands:  Normal  Vagina:  Normal  Cervix:  3 cm polyp, grasped with ring forcep removed intact sent for biopsy  Uterus:   normal in size, shape and contour.  Midline and mobile  Adnexa/parametria:     Rt: Without masses or tenderness.   Lt: Without masses or tenderness.  Anus and perineum: Normal  Digital rectal exam: Normal sphincter tone without palpated masses or tenderness  Assessment/Plan:  41 y.o. WAF G1 P1 for annual exam  with no complaints.  Monthly 3 day cycle/not sexually active Cervical polyp  Plan: Cervical polyp sent for biopsy, instructed to call if spotting between cycles. SBE's, continue annual screening mammogram, calcium rich diet, MVI daily encouraged. Contraception reviewed declines, condoms encouraged if sexually active. CBC. Pap normal 2017, new screening guidelines reviewed. Flu shot given    Harrington Challenger [facility], 11:40 AM 10/07/2017

## 2018-02-04 DIAGNOSIS — Z0000 Encounter for general adult medical examination without abnormal findings: Secondary | ICD-10-CM | POA: Diagnosis not present

## 2018-02-04 DIAGNOSIS — J309 Allergic rhinitis, unspecified: Secondary | ICD-10-CM | POA: Diagnosis not present

## 2018-08-11 ENCOUNTER — Other Ambulatory Visit: Payer: Self-pay | Admitting: Family Medicine

## 2018-08-11 DIAGNOSIS — Z1231 Encounter for screening mammogram for malignant neoplasm of breast: Secondary | ICD-10-CM

## 2018-09-07 ENCOUNTER — Ambulatory Visit
Admission: RE | Admit: 2018-09-07 | Discharge: 2018-09-07 | Disposition: A | Discharge status: Home / Self Care | Payer: BLUE CROSS/BLUE SHIELD | Source: Ambulatory Visit | Attending: Family Medicine | Admitting: Family Medicine

## 2018-09-07 DIAGNOSIS — Z1231 Encounter for screening mammogram for malignant neoplasm of breast: Secondary | ICD-10-CM | POA: Diagnosis not present

## 2018-09-07 IMAGING — MG 2D DIGITAL DIAGNOSTIC UNILATERAL LEFT MAMMOGRAM WITH CAD AND ADJ
6 of 9 series · 6 of 21 positions shown · non-contrast
Comparison: Previous exam(s).

CLINICAL DATA: 40-year-old female for further evaluation of
possible left breast mass on screening mammogram.

EXAM:
2D DIGITAL DIAGNOSTIC LEFT MAMMOGRAM WITH CAD AND ADJUNCT TOMO
ULTRASOUND LEFT BREAST

[L MLO synth-2D]
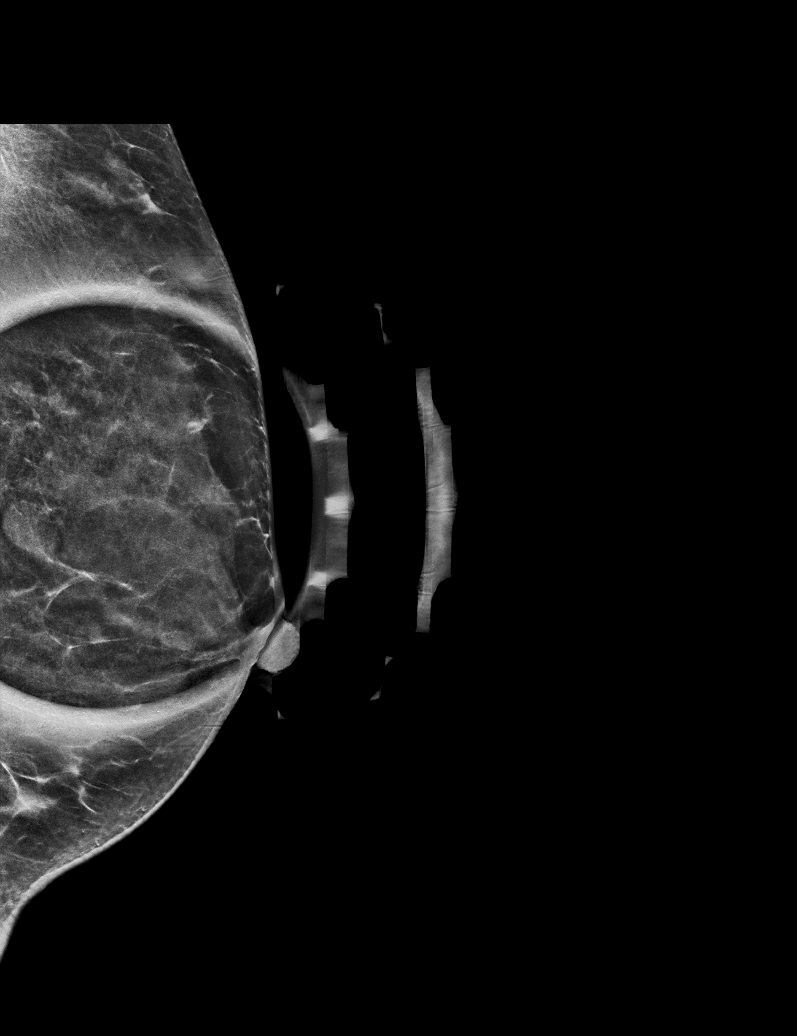

[L ML]
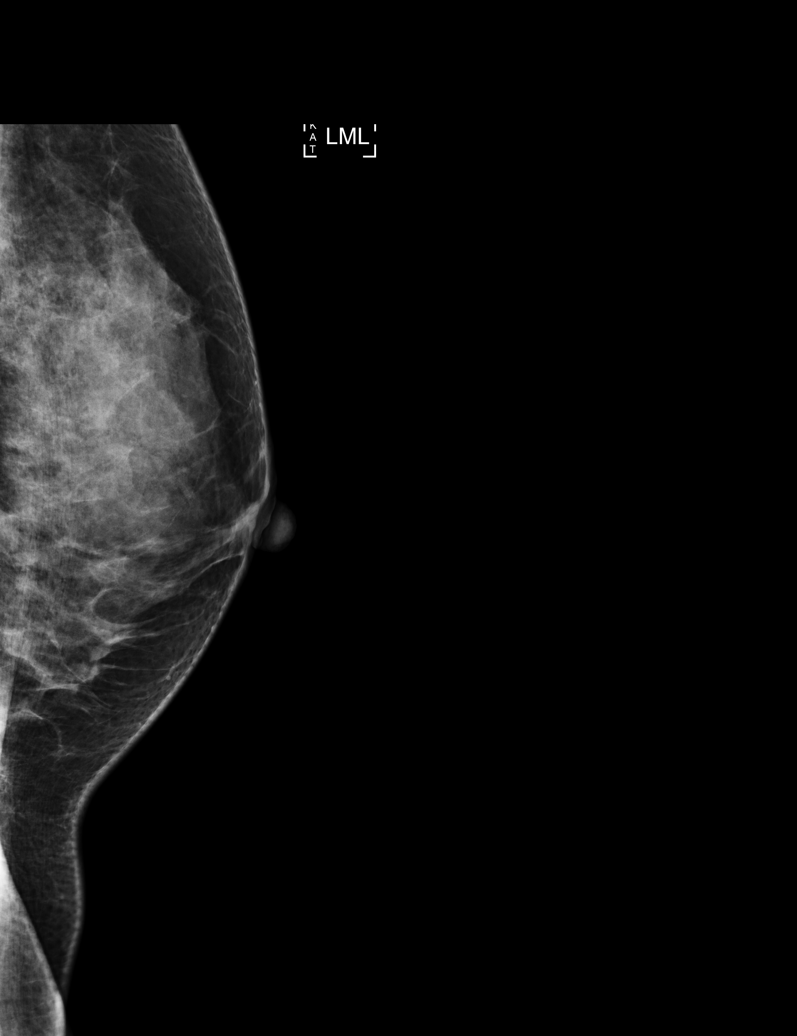

[L CC synth-2D]
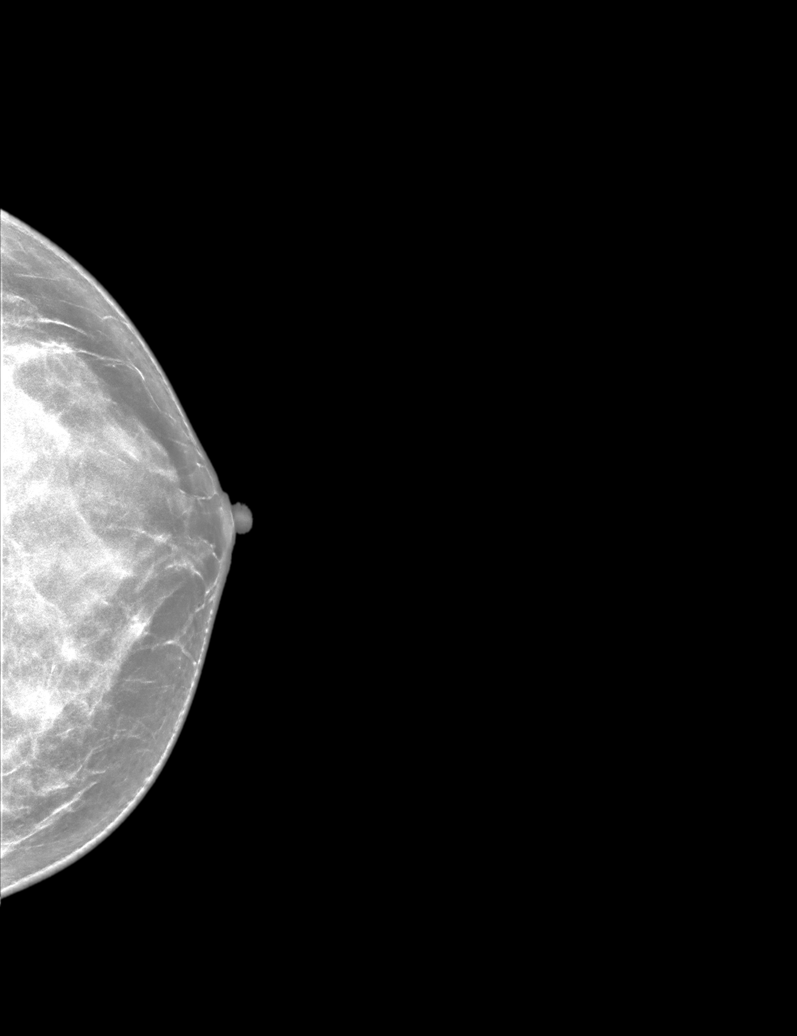

[L CC]
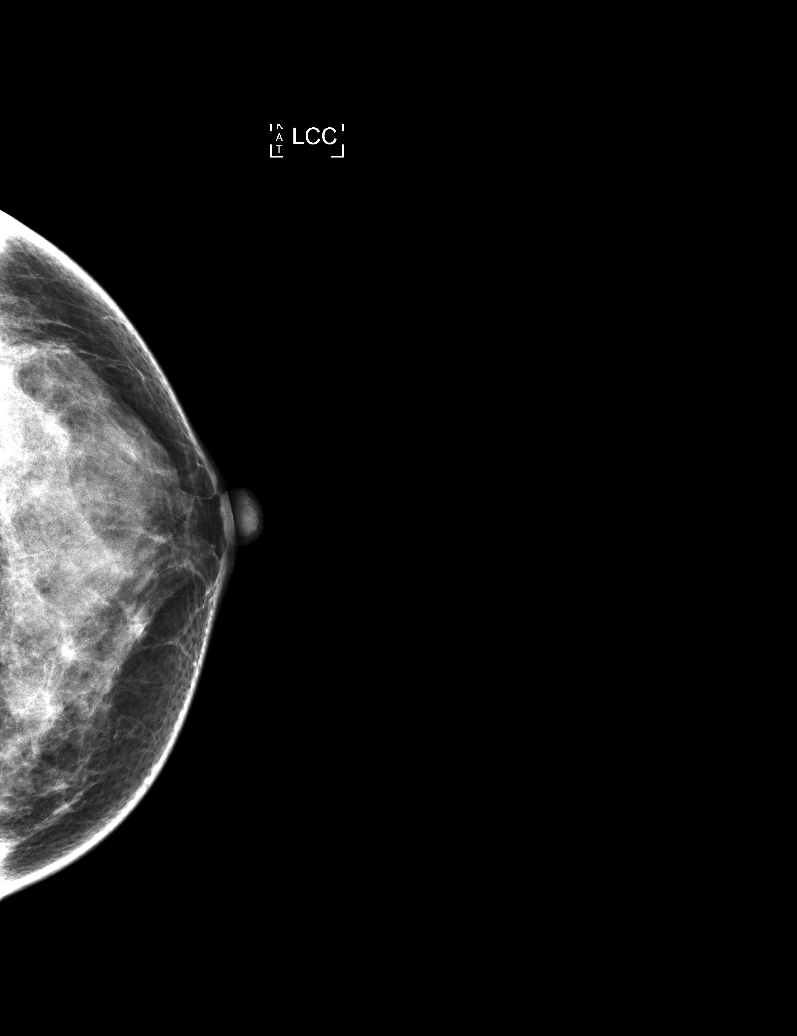

[L ML synth-2D]
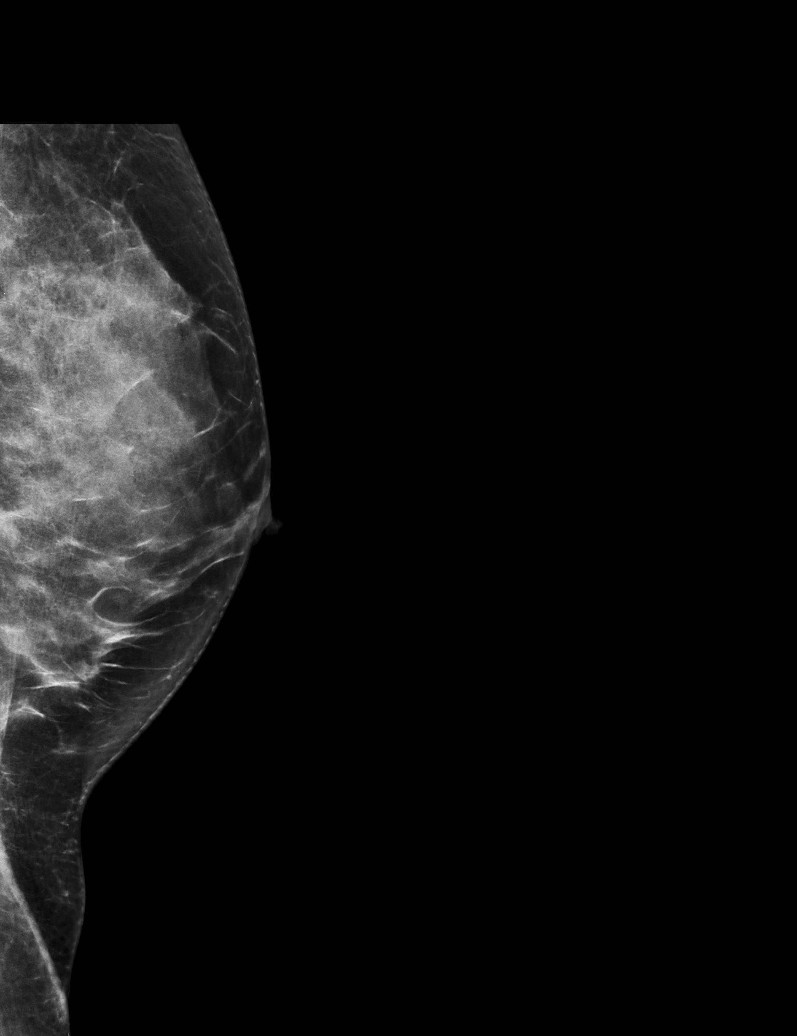

[L MLO]
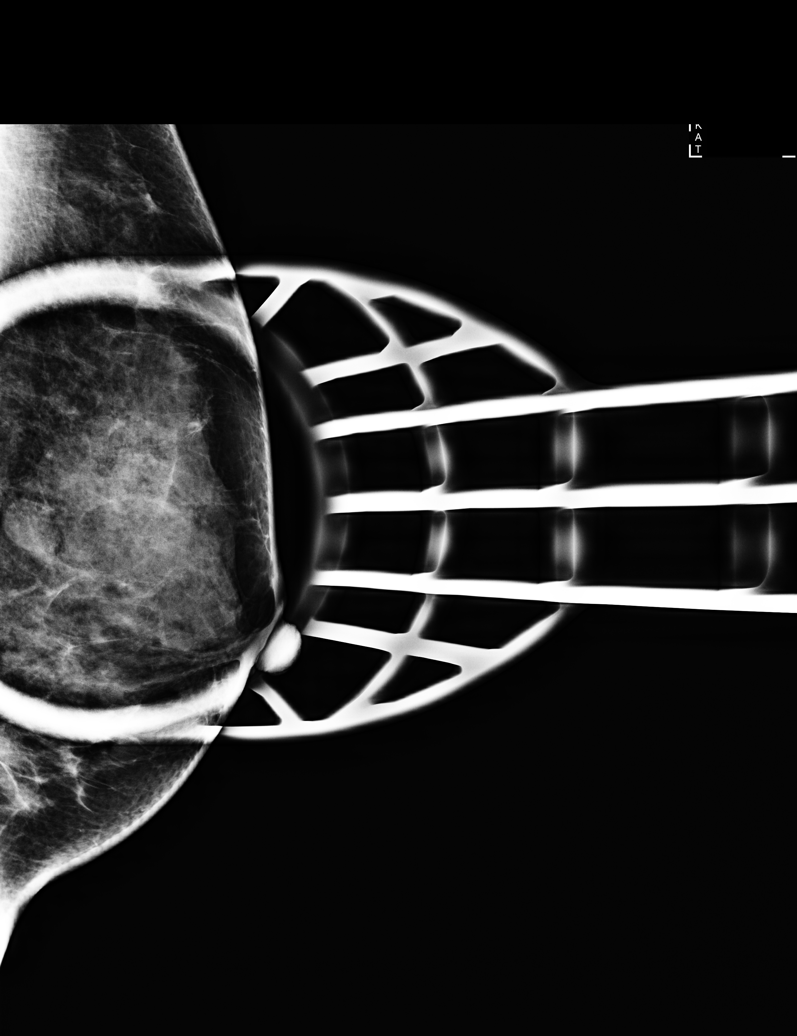

[6 of 21 positions shown; findings below may reference images not displayed]

ACR Breast Density Category d: The breast tissue is extremely dense,
which lowers the sensitivity of mammography.
FINDINGS: 2D and 3D full field and spot compression views of the left breast
demonstrate circumscribed oval masses within the upper inner left
breast and outer left breast.

Mammographic images were processed with CAD.

Targeted ultrasound is performed, showing multiple benign simple
cysts within the upper and outer left breast. These include a 1 x
0.6 x 1.2 cm simple cyst at the 11 o'clock position 4 cm from the
nipple and a 1 x 0.6 x 1.3 cm simple cyst at the 3 o'clock position
3 cm from the nipple.
IMPRESSION: Benign simple cysts within the left breast corresponding to the
screening study findings.

RECOMMENDATION:
Bilateral screening mammograms in 1 year.

I have discussed the findings and recommendations with the patient.
Results were also provided in writing at the conclusion of the
visit. If applicable, a reminder letter will be sent to the patient
regarding the next appointment.

BI-RADS CATEGORY  2: Benign.

## 2018-10-11 ENCOUNTER — Ambulatory Visit: Payer: BLUE CROSS/BLUE SHIELD | Admitting: Women's Health

## 2018-10-11 ENCOUNTER — Encounter: Payer: Self-pay | Admitting: Women's Health

## 2018-10-11 VITALS — BP 128/80 | Ht 63.0 in | Wt 124.0 lb

## 2018-10-11 DIAGNOSIS — Z01419 Encounter for gynecological examination (general) (routine) without abnormal findings: Secondary | ICD-10-CM

## 2018-10-11 LAB — CBC WITH DIFFERENTIAL/PLATELET
BASOS PCT: 0.5 %
Basophils Absolute: 30 cells/uL (ref 0–200)
EOS ABS: 71 {cells}/uL (ref 15–500)
Eosinophils Relative: 1.2 %
HEMATOCRIT: 38.1 % (ref 35.0–45.0)
Hemoglobin: 13.2 g/dL (ref 11.7–15.5)
Lymphs Abs: 2685 cells/uL (ref 850–3900)
MCH: 30 pg (ref 27.0–33.0)
MCHC: 34.6 g/dL (ref 32.0–36.0)
MCV: 86.6 fL (ref 80.0–100.0)
MONOS PCT: 11.1 %
MPV: 9.7 fL (ref 7.5–12.5)
NEUTROS PCT: 41.7 %
Neutro Abs: 2460 cells/uL (ref 1500–7800)
Platelets: 350 10*3/uL (ref 140–400)
RBC: 4.4 10*6/uL (ref 3.80–5.10)
RDW: 11.8 % (ref 11.0–15.0)
TOTAL LYMPHOCYTE: 45.5 %
WBC: 5.9 10*3/uL (ref 3.8–10.8)
WBCMIX: 655 {cells}/uL (ref 200–950)

## 2018-10-11 LAB — GLUCOSE, RANDOM: Glucose, Bld: 85 mg/dL (ref 65–99)

## 2018-10-11 NOTE — Patient Instructions (Signed)

## 2018-10-11 NOTE — Progress Notes (Signed)
Teresa Wheeler 05-Dec-1976 [id number]    History:    Presents for annual exam.  Regular monthly 3 to 4 days cycle.  Not sexually active in years.  Normal Pap and mammogram history.  Widowed, husband heart disease.  Past medical history, past surgical history, family history and social history were all reviewed and documented in the EPIC chart.  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  [place] mother lives in [place] planning a trip next year.  Mother healthy, father died in [place].  ROS:  A ROS was performed and pertinent positives and negatives are included.  Exam:  Vitals:   10/11/18 1152  BP: 128/80  Weight: 124 lb (56.2 kg)  Height: 5\' 3"  (1.6 m)   Body mass index is 21.97 kg/m.   General appearance:  Normal Thyroid:  Symmetrical, normal in size, without palpable masses or nodularity. Respiratory  Auscultation:  Clear without wheezing or rhonchi Cardiovascular  Auscultation:  Regular rate, without rubs, murmurs or gallops  Edema/varicosities:  Not grossly evident Abdominal  Soft,nontender, without masses, guarding or rebound.  Liver/spleen:  No organomegaly noted  Hernia:  None appreciated  Skin  Inspection:  Grossly normal   Breasts: Examined lying and sitting.     Right: Without masses, retractions, discharge or axillary adenopathy.     Left: Without masses, retractions, discharge or axillary adenopathy. Gentitourinary   Inguinal/mons:  Normal without inguinal adenopathy  External genitalia:  Normal  BUS/Urethra/Skene's glands:  Normal  Vagina:  Normal  Cervix:  Normal  Uterus:  normal in size, shape and contour.  Midline and mobile  Adnexa/parametria:     Rt: Without masses or tenderness.   Lt: Without masses or tenderness.  Anus and perineum: Normal  Digital rectal exam: Normal sphincter tone without palpated masses or tenderness  Assessment/Plan:  42 y.o. W AF G1, P1 for annual exam no complaints.  Regular monthly cycle/not sexually  active  Plan: Contraception options reviewed and declines.  SBE's, continue annual 3D screening mammogram, regular exercise (hula hoop), calcium rich foods, vitamin D 1000 daily encouraged.  CBC, glucose, Pap normal 2017, new screening guidelines reviewed.  Instructed to come next year fasting and will check lipid panel.    Harrington Challenger [facility], 1:11 PM 10/11/2018

## 2018-11-08 DIAGNOSIS — Z23 Encounter for immunization: Secondary | ICD-10-CM | POA: Diagnosis not present

## 2019-10-10 DIAGNOSIS — J301 Allergic rhinitis due to pollen: Secondary | ICD-10-CM | POA: Diagnosis not present

## 2019-10-17 ENCOUNTER — Other Ambulatory Visit: Payer: Self-pay | Admitting: Family Medicine

## 2019-10-17 DIAGNOSIS — Z1231 Encounter for screening mammogram for malignant neoplasm of breast: Secondary | ICD-10-CM

## 2019-10-18 ENCOUNTER — Other Ambulatory Visit: Payer: Self-pay

## 2019-10-19 ENCOUNTER — Ambulatory Visit: Payer: BC Managed Care – PPO | Admitting: Women's Health

## 2019-10-19 ENCOUNTER — Encounter: Payer: Self-pay | Admitting: Women's Health

## 2019-10-19 VITALS — BP 124/80 | Ht 63.0 in | Wt 123.0 lb

## 2019-10-19 DIAGNOSIS — Z13220 Encounter for screening for lipoid disorders: Secondary | ICD-10-CM | POA: Diagnosis not present

## 2019-10-19 DIAGNOSIS — Z01419 Encounter for gynecological examination (general) (routine) without abnormal findings: Secondary | ICD-10-CM

## 2019-10-19 DIAGNOSIS — N946 Dysmenorrhea, unspecified: Secondary | ICD-10-CM | POA: Diagnosis not present

## 2019-10-19 DIAGNOSIS — Z131 Encounter for screening for diabetes mellitus: Secondary | ICD-10-CM | POA: Diagnosis not present

## 2019-10-19 MED ORDER — IBUPROFEN 600 MG PO TABS: 600.0000 mg | ORAL_TABLET | Freq: Three times a day (TID) | ORAL | 1 refills | Status: DC | PRN

## 2019-10-19 NOTE — Patient Instructions (Addendum)
Eucerin cream in a tub  [phone] (personal cell)   Text me if above does not help  Vit D 1000 mg daily     Health Maintenance, Female Adopting a healthy lifestyle and getting preventive care are important in promoting health and wellness. Ask your health care provider about:  The right schedule for you to have regular tests and exams.  Things you can do on your own to prevent diseases and keep yourself healthy. What should I know about diet, weight, and exercise? Eat a healthy diet   Eat a diet that includes plenty of vegetables, fruits, low-fat dairy products, and lean protein.  Do not eat a lot of foods that are high in solid fats, added sugars, or sodium. Maintain a healthy weight Body mass index (BMI) is used to identify weight problems. It estimates body fat based on height and weight. Your health care provider can help determine your BMI and help you achieve or maintain a healthy weight. Get regular exercise Get regular exercise. This is one of the most important things you can do for your health. Most adults should:  Exercise for at least 150 minutes each week. The exercise should increase your heart rate and make you sweat (moderate-intensity exercise).  Do strengthening exercises at least twice a week. This is in addition to the moderate-intensity exercise.  Spend less time sitting. Even light physical activity can be beneficial. Watch cholesterol and blood lipids Have your blood tested for lipids and cholesterol at 44 years of age, then have this test every 5 years. Have your cholesterol levels checked more often if:  Your lipid or cholesterol levels are high.  You are older than 44 years of age.  You are at high risk for heart disease. What should I know about cancer screening? Depending on your health history and family history, you may need to have cancer screening at various ages. This may include screening for:  Breast cancer.  Cervical cancer.   Colorectal cancer.  Skin cancer.  Lung cancer. What should I know about heart disease, diabetes, and high blood pressure? Blood pressure and heart disease  High blood pressure causes heart disease and increases the risk of stroke. This is more likely to develop in people who have high blood pressure readings, are of African descent, or are overweight.  Have your blood pressure checked: ? Every 3-5 years if you are 59-39 years of age. ? Every year if you are 60 years old or older. Diabetes Have regular diabetes screenings. This checks your fasting blood sugar level. Have the screening done:  Once every three years after age 58 if you are at a normal weight and have a low risk for diabetes.  More often and at a younger age if you are overweight or have a high risk for diabetes. What should I know about preventing infection? Hepatitis B If you have a higher risk for hepatitis B, you should be screened for this virus. Talk with your health care provider to find out if you are at risk for hepatitis B infection. Hepatitis C Testing is recommended for:  Everyone born from [birth year] through 1966.  Anyone with known risk factors for hepatitis C. Sexually transmitted infections (STIs)  Get screened for STIs, including gonorrhea and chlamydia, if: ? You are sexually active and are younger than 44 years of age. ? You are older than 44 years of age and your health care provider tells you that you are at risk for this type of  infection. ? Your sexual activity has changed since you were last screened, and you are at increased risk for chlamydia or gonorrhea. Ask your health care provider if you are at risk.  Ask your health care provider about whether you are at high risk for HIV. Your health care provider may recommend a prescription medicine to help prevent HIV infection. If you choose to take medicine to prevent HIV, you should first get tested for HIV. You should then be tested every 3 months for  as long as you are taking the medicine. Pregnancy  If you are about to stop having your period (premenopausal) and you may become pregnant, seek counseling before you get pregnant.  Take 400 to 800 micrograms (mcg) of folic acid every day if you become pregnant.  Ask for birth control (contraception) if you want to prevent pregnancy. Osteoporosis and menopause Osteoporosis is a disease in which the bones lose minerals and strength with aging. This can result in bone fractures. If you are 82 years old or older, or if you are at risk for osteoporosis and fractures, ask your health care provider if you should:  Be screened for bone loss.  Take a calcium or vitamin D supplement to lower your risk of fractures.  Be given hormone replacement therapy (HRT) to treat symptoms of menopause. Follow these instructions at home: Lifestyle  Do not use any products that contain nicotine or tobacco, such as cigarettes, e-cigarettes, and chewing tobacco. If you need help quitting, ask your health care provider.  Do not use street drugs.  Do not share needles.  Ask your health care provider for help if you need support or information about quitting drugs. Alcohol use  Do not drink alcohol if: ? Your health care provider tells you not to drink. ? You are pregnant, may be pregnant, or are planning to become pregnant.  If you drink alcohol: ? Limit how much you use to 0-1 drink a day. ? Limit intake if you are breastfeeding.  Be aware of how much alcohol is in your drink. In the [place] one drink equals one 12 oz bottle of beer (355 mL), one 5 oz glass of wine (148 mL), or one 1 oz glass of hard liquor (44 mL). General instructions  Schedule regular health, dental, and eye exams.  Stay current with your vaccines.  Tell your health care provider if: ? You often feel depressed. ? You have ever been abused or do not feel safe at home. Summary  Adopting a healthy lifestyle and getting preventive  care are important in promoting health and wellness.  Follow your health care provider's instructions about healthy diet, exercising, and getting tested or screened for diseases.  Follow your health care provider's instructions on monitoring your cholesterol and blood pressure. This information is not intended to replace advice given to you by your health care provider. Make sure you discuss any questions you have with your health care provider. Document Released: 06/30/2011 Document Revised: 12/08/2018 Document Reviewed: 12/08/2018 Elsevier Patient Education  2020 [employer].

## 2019-10-19 NOTE — Progress Notes (Signed)
Teresa Wheeler Jul 06, 1976 [id number]    History:    Presents for annual exam.  Regular 3 to 4-day month monthly cycle.  Not sexually active in 10 years, widow.  Normal Pap and mammogram history.  Headaches week prior to her cycle.  Past medical history, past surgical history, family history and social history were all reviewed and documented in the EPIC chart.  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  44 year old daughter doing well has had Gardasil.  Mother healthy lives in [place], father deceased from [place] War.  ROS:  A ROS was performed and pertinent positives and negatives are included.  Exam:  Vitals:   10/19/19 1018  BP: 124/80  Weight: 123 lb (55.8 kg)  Height: 5\' 3"  (1.6 m)   Body mass index is 21.79 kg/m.   General appearance:  Normal Thyroid:  Symmetrical, normal in size, without palpable masses or nodularity. Respiratory  Auscultation:  Clear without wheezing or rhonchi Cardiovascular  Auscultation:  Regular rate, without rubs, murmurs or gallops  Edema/varicosities:  Not grossly evident Abdominal  Soft,nontender, without masses, guarding or rebound.  Liver/spleen:  No organomegaly noted  Hernia:  None appreciated  Skin  Inspection:  Grossly normal   Breasts: Examined lying and sitting.     Right: Without masses, retractions, discharge or axillary adenopathy.     Left: Without masses, retractions, discharge or axillary adenopathy. Gentitourinary   Inguinal/mons:  Normal without inguinal adenopathy  External genitalia:  Normal  BUS/Urethra/Skene's glands:  Normal  Vagina:  Normal  Cervix:  Normal  Uterus:   normal in size, shape and contour.  Midline and mobile  Adnexa/parametria:     Rt: Without masses or tenderness.   Lt: Without masses or tenderness.  Anus and perineum: Normal  Digital rectal exam: Normal sphincter tone without palpated masses or tenderness  Assessment/Plan:  43 y.o. WF G1, P1 for annual exam with no complaints.  Monthly 3 to 4-day  cycle/not sexually active  Plan: Contraception reviewed, declines, states will return to office if becomes sexually active.  SBEs, annual screening mammogram, calcium rich foods, vitamin D 1000 daily encouraged.  Encouraged to continue active lifestyle of regular exercise, healthy diet.  Motrin 600 mg every 8 hours as needed for headaches week prior to her cycle.  Pap normal with negative HR HPV 2017, new screening guidelines reviewed.  CBC, glucose, lipid panel.    [place], 10:41 AM 10/19/2019

## 2019-10-20 LAB — CBC WITH DIFFERENTIAL/PLATELET
Absolute Monocytes: 604 cells/uL (ref 200–950)
Basophils Absolute: 32 cells/uL (ref 0–200)
Basophils Relative: 0.6 %
Eosinophils Absolute: 101 cells/uL (ref 15–500)
Eosinophils Relative: 1.9 %
HCT: 40.4 % (ref 35.0–45.0)
Hemoglobin: 13.6 g/dL (ref 11.7–15.5)
Lymphs Abs: 2263 cells/uL (ref 850–3900)
MCH: 30 pg (ref 27.0–33.0)
MCHC: 33.7 g/dL (ref 32.0–36.0)
MCV: 89.2 fL (ref 80.0–100.0)
MPV: 9.8 fL (ref 7.5–12.5)
Monocytes Relative: 11.4 %
Neutro Abs: 2300 cells/uL (ref 1500–7800)
Neutrophils Relative %: 43.4 %
Platelets: 303 10*3/uL (ref 140–400)
RBC: 4.53 10*6/uL (ref 3.80–5.10)
RDW: 11.8 % (ref 11.0–15.0)
Total Lymphocyte: 42.7 %
WBC: 5.3 10*3/uL (ref 3.8–10.8)

## 2019-10-20 LAB — LIPID PANEL
Cholesterol: 175 mg/dL (ref <200)
HDL: 58 mg/dL (ref >=50)
LDL Cholesterol (Calc): 97 mg/dL (calc)
Non-HDL Cholesterol (Calc): 117 mg/dL (calc) (ref <130)
Total CHOL/HDL Ratio: 3 (calc) (ref <5.0)
Triglycerides: 106 mg/dL (ref <150)

## 2019-10-20 LAB — GLUCOSE, RANDOM: Glucose, Bld: 89 mg/dL (ref 65–99)

## 2019-10-28 DIAGNOSIS — Z23 Encounter for immunization: Secondary | ICD-10-CM | POA: Diagnosis not present

## 2019-11-02 ENCOUNTER — Ambulatory Visit: Payer: BC Managed Care – PPO | Admitting: Allergy

## 2019-11-23 ENCOUNTER — Other Ambulatory Visit: Payer: Self-pay

## 2019-11-23 ENCOUNTER — Encounter: Payer: Self-pay | Admitting: Allergy

## 2019-11-23 ENCOUNTER — Ambulatory Visit: Payer: BC Managed Care – PPO | Admitting: Allergy

## 2019-11-23 VITALS — BP 110/86 | HR 82 | Temp 98.0°F | Resp 16 | Ht 63.0 in | Wt 125.0 lb

## 2019-11-23 DIAGNOSIS — J3089 Other allergic rhinitis: Secondary | ICD-10-CM | POA: Insufficient documentation

## 2019-11-23 DIAGNOSIS — H1013 Acute atopic conjunctivitis, bilateral: Secondary | ICD-10-CM | POA: Diagnosis not present

## 2019-11-23 MED ORDER — AZELASTINE HCL 0.1 % NA SOLN: NASAL | 5 refills | Status: DC

## 2019-11-23 MED ORDER — TRIAMCINOLONE ACETONIDE 55 MCG/ACT NA AERO: 1.0000 | INHALATION_SPRAY | Freq: Two times a day (BID) | NASAL | 5 refills | Status: DC

## 2019-11-23 MED ORDER — OLOPATADINE HCL 0.1 % OP SOLN: 1.0000 [drp] | Freq: Two times a day (BID) | OPHTHALMIC | 5 refills | Status: DC | PRN

## 2019-11-23 NOTE — Assessment & Plan Note (Signed)
   See assessment and plan as above allergic rhinitis. 

## 2019-11-23 NOTE — Assessment & Plan Note (Signed)
Perennial rhinoconjunctivitis symptoms which worsens when being outdoors.  Using Claritin and Flonase as needed with some benefit.  Singulair caused drowsiness.  No previous allergy/ENT evaluation.  Today's skin testing showed: Positive to grass pollen, tree pollen and borderline to ragweed pollen.   Start environmental control measures.  Start Nasacort 1 spray per nostril twice a day for nasal congestion.  Use Astelin nasal spray 1-2 sprays per nostril 1-2 times a day as needed for drainage.  May use over the counter antihistamines such as Zyrtec (cetirizine), Claritin (loratadine), Allegra (fexofenadine), or Xyzal (levocetirizine) daily as needed.  May use olopatadine 0.1% 1 drop in each eye twice daily as needed for itchy/watery eyes.   Had a detailed discussion with patient/family that clinical history is suggestive of allergic rhinitis, and may benefit from allergy immunotherapy (AIT). Discussed in detail regarding the dosing, schedule, side effects (mild to moderate local allergic reaction and rarely systemic allergic reactions including anaphylaxis), and benefits (significant improvement in nasal symptoms, seasonal flares of asthma) of immunotherapy with the patient. There is significant time commitment involved with allergy shots, which includes weekly immunotherapy injections for first 9-12 months and then biweekly to monthly injections for 3-5 years.

## 2019-11-23 NOTE — Patient Instructions (Addendum)
Today's skin testing showed: Positive to grass pollen, tree pollen, ragweed pollen.    Start Nasacort 1 spray per nostril twice a day for nasal congestion.  Use Astelin nasal spray 1-2 sprays per nostril 1-2 times a day as needed for drainage.  May use over the counter antihistamines such as Zyrtec (cetirizine), Claritin (loratadine), Allegra (fexofenadine), or Xyzal (levocetirizine) daily as needed.  May use olopatadine 0.1% 1 drop in each eye twice daily as needed for itchy/watery eyes.   Read about allergy shots.   Follow up in 3 months or sooner if needed.   Reducing Pollen Exposure . Pollen seasons: trees (spring), grass (summer) and ragweed/weeds (fall). Marland Kitchen Keep windows closed in your home and car to lower pollen exposure.  Susa Simmonds air conditioning in the bedroom and throughout the house if possible.  . Avoid going out in dry windy days - especially early morning. . Pollen counts are highest between 5 - 10 AM and on dry, hot and windy days.  . Save outside activities for late afternoon or after a heavy rain, when pollen levels are lower.  . Avoid mowing of grass if you have grass pollen allergy. Marland Kitchen Be aware that pollen can also be transported indoors on people and pets.  . Dry your clothes in an automatic dryer rather than hanging them outside where they might collect pollen.  . Rinse hair and eyes before bedtime.

## 2019-11-23 NOTE — Progress Notes (Signed)
New Patient Note  RE: Teresa Wheeler MRN: [id number] DOB: 03-14-1976 Date of Office Visit: 11/23/2019  Referring provider: Lawerance Cruel, MD Primary care provider: Marda Stalker, PA-C  Chief Complaint: Allergic Rhinitis  (several days, )  History of Present Illness: I had the pleasure of seeing Teresa Wheeler for initial evaluation at [facility] on 11/23/2019. She is a 43 y.o. female, who is referred here by Marda Stalker, PA-C for the evaluation of allergies.   Rhinitis: She reports symptoms of rhinorrhea, itchy/watery eyes, sneezing, nasal congestion. Symptoms have been going on for 7 years. The symptoms are present all year around. Other triggers include exposure to outdoors. Anosmia: no. Headache: slight. She has used Claritin, Fluticasone 1 spray as needed with some improvement in symptoms. Singulair caused drowsiness. Sinus infections: no. Previous work up includes: no. Previous ENT evaluation: no. Previous sinus imaging: no. History of nasal polyps: no. Last eye exam: 2 months ago. History of reflux: yes sometimes and takes pepto as needed.   Assessment and Plan: Teresa Wheeler is a 43 y.o. female with: Other allergic rhinitis Perennial rhinoconjunctivitis symptoms which worsens when being outdoors.  Using Claritin and Flonase as needed with some benefit.  Singulair caused drowsiness.  No previous allergy/ENT evaluation.  Today's skin testing showed: Positive to grass pollen, tree pollen and borderline to ragweed pollen.   Start environmental control measures.  Start Nasacort 1 spray per nostril twice a day for nasal congestion.  Use Astelin nasal spray 1-2 sprays per nostril 1-2 times a day as needed for drainage.  May use over the counter antihistamines such as Zyrtec (cetirizine), Claritin (loratadine), Allegra (fexofenadine), or Xyzal (levocetirizine) daily as needed.  May use olopatadine 0.1% 1 drop in each eye twice daily as needed for  itchy/watery eyes.   Had a detailed discussion with patient/family that clinical history is suggestive of allergic rhinitis, and may benefit from allergy immunotherapy (AIT). Discussed in detail regarding the dosing, schedule, side effects (mild to moderate local allergic reaction and rarely systemic allergic reactions including anaphylaxis), and benefits (significant improvement in nasal symptoms, seasonal flares of asthma) of immunotherapy with the patient. There is significant time commitment involved with allergy shots, which includes weekly immunotherapy injections for first 9-12 months and then biweekly to monthly injections for 3-5 years.   Allergic conjunctivitis of both eyes  See assessment and plan as above allergic rhinitis.  Return in about 3 months (around 02/23/2020).  Meds ordered this encounter  Medications  . triamcinolone (NASACORT) 55 MCG/ACT AERO nasal inhaler    Sig: Place 1 spray into the nose 2 (two) times daily. For nasal congestion.    Dispense:  1 Inhaler    Refill:  5  . azelastine (ASTELIN) 0.1 % nasal spray    Sig: May use 1-2 sprays per nostril 1-2 times a day as needed for drainage.    Dispense:  30 mL    Refill:  5  . olopatadine (PATANOL) 0.1 % ophthalmic solution    Sig: Place 1 drop into both eyes 2 (two) times daily as needed for allergies (itchy/watery eyes).    Dispense:  5 mL    Refill:  5   Other allergy screening: Asthma: no Rhino conjunctivitis: yes Food allergy: no Medication allergy: no Hymenoptera allergy: no Urticaria: no Eczema:no History of recurrent infections suggestive of immunodeficency: no  Diagnostics: Skin Testing: Environmental allergy panel. Positive test to: grass, tree and borderline to ragweed.  Results discussed with patient/family. Airborne Adult  Perc - 11/23/19 0940    Time Antigen Placed  0920    Allergen Manufacturer  Waynette Buttery    Location  Back    Number of Test  59    Panel 1  Select    1. Control-Buffer 50%  Glycerol  Negative    2. Control-Histamine 1 mg/ml  2+    3. Albumin saline  Negative    4. Bahia  Negative    5. [place]  Negative    6. Johnson  Negative    7. Kentucky Blue  2+    8. Meadow Fescue  2+    9. Perennial Rye  2+    10. Sweet Vernal  Negative    11. Timothy  3+    12. Cocklebur  Negative    13. Burweed Marshelder  Negative    14. Ragweed, short  Negative    15. Ragweed, Giant  Negative    16. Plantain,  English  Negative    17. Lamb's Quarters  Negative    18. Sheep Sorrell  Negative    19. Rough Pigweed  Negative    20. Marsh Elder, Rough  Negative    21. Mugwort, Common  Negative    22. Ash mix  Negative    23. Birch mix  Negative    24. Beech American  Negative    25. Box, Elder  --   +/-   26. Cedar, red  Negative    27. Cottonwood, [place]  Negative    28. Elm mix  Negative    29. Hickory mix  2+    30. Maple mix  Negative    31. Oak, [place] mix  4+    32. Pecan Pollen  3+    33. Pine mix  Negative    34. Sycamore Eastern  Negative    35. Walnut, Black Pollen  2+    36. Alternaria alternata  Negative    37. Cladosporium Herbarum  Negative    38. Aspergillus mix  Negative    39. Penicillium mix  Negative    40. Bipolaris sorokiniana (Helminthosporium)  Negative    41. Drechslera spicifera (Curvularia)  Negative    42. Mucor plumbeus  Negative    43. Fusarium moniliforme  Negative    44. Aureobasidium pullulans (pullulara)  Negative    45. Rhizopus oryzae  Negative    46. Botrytis cinera  Negative    47. Epicoccum nigrum  Negative    48. Phoma betae  Negative    49. Candida Albicans  Negative    50. Trichophyton mentagrophytes  Negative    51. Mite, D Farinae  5,000 AU/ml  Negative    52. Mite, D Pteronyssinus  5,000 AU/ml  Negative    53. Cat Hair 10,000 BAU/ml  Negative    54.  Dog Epithelia  Negative    55. Mixed Feathers  Negative    56. Horse Epithelia  Negative    57. Cockroach, German  Negative    58. Mouse  Negative    59. Tobacco  Leaf  Negative     Intradermal - 11/23/19 1034    Time Antigen Placed  1015    Allergen Manufacturer  Waynette Buttery    Location  Arm    Number of Test  13    Intradermal  Select    Control  Negative    [place]  2+    Johnson  Negative    Ragweed mix  --   +/-  Weed mix  Negative    Mold 1  Negative    Mold 2  Negative    Mold 3  Negative    Mold 4  Negative    Cat  Negative    Dog  Negative    Cockroach  Negative    Mite mix  Negative       Past Medical History: Patient Active Problem List   Diagnosis Date Noted  . Other allergic rhinitis 11/23/2019  . Allergic conjunctivitis of both eyes 11/23/2019  . Endocervical polyp 10/07/2017   Past Medical History:  Diagnosis Date  . Chlamydia 03/2010   Past Surgical History: History reviewed. No pertinent surgical history. Medication List:  Current Outpatient Medications  Medication Sig Dispense Refill  . cholecalciferol (VITAMIN D3) 25 MCG (1000 UT) tablet Take 1,000 Units by mouth daily.    Marland Kitchen. ibuprofen (ADVIL) 600 MG tablet Take 1 tablet (600 mg total) by mouth every 8 (eight) hours as needed. 60 tablet 1  . Loratadine (CLARITIN PO) Take 1 tablet by mouth daily.    Marland Kitchen. azelastine (ASTELIN) 0.1 % nasal spray May use 1-2 sprays per nostril 1-2 times a day as needed for drainage. 30 mL 5  . olopatadine (PATANOL) 0.1 % ophthalmic solution Place 1 drop into both eyes 2 (two) times daily as needed for allergies (itchy/watery eyes). 5 mL 5  . triamcinolone (NASACORT) 55 MCG/ACT AERO nasal inhaler Place 1 spray into the nose 2 (two) times daily. For nasal congestion. 1 Inhaler 5   No current facility-administered medications for this visit.    Allergies: Allergies  Allergen Reactions  . Other     ENVIRONMETAL   Social History: Social History   Socioeconomic History  . Marital status: Widowed    Spouse name: Not on file  . Number of children: Not on file  . Years of education: Not on file  . Highest education level: Not on file   Occupational History  . Not on file  Social Needs  . Financial resource strain: Not on file  . Food insecurity    Worry: Not on file    Inability: Not on file  . Transportation needs    Medical: Not on file    Non-medical: Not on file  Tobacco Use  . Smoking status: Never Smoker  . Smokeless tobacco: Never Used  Substance and Sexual Activity  . Alcohol use: No    Alcohol/week: 0.0 standard drinks  . Drug use: No  . Sexual activity: Not Currently  Lifestyle  . Physical activity    Days per week: Not on file    Minutes per session: Not on file  . Stress: Not on file  Relationships  . Social Musicianconnections    Talks on phone: Not on file    Gets together: Not on file    Attends religious service: Not on file    Active member of club or organization: Not on file    Attends meetings of clubs or organizations: Not on file    Relationship status: Not on file  Other Topics Concern  . Not on file  Social History Narrative  . Not on file   Lives in a house built in 1958. Smoking: denies Occupation: Geologist, engineeringaccounting  Environmental HistorySurveyor, minerals: Water Damage/mildew in the house: no Carpet in the family room: no Carpet in the bedroom: no Heating: gas Cooling: central Pet: no  Family History: History reviewed. No pertinent family history. Problem  Relation Asthma                                   no Eczema                                no Food allergy                          no Allergic rhino conjunctivitis     no  Review of Systems  Constitutional: Negative for appetite change, chills, fever and unexpected weight change.  HENT: Positive for congestion, postnasal drip, rhinorrhea and sneezing.   Eyes: Positive for itching.  Respiratory: Negative for cough, chest tightness, shortness of breath and wheezing.   Cardiovascular: Negative for chest pain.  Gastrointestinal: Negative for abdominal pain.  Genitourinary: Negative for difficulty urinating.   Skin: Negative for rash.  Allergic/Immunologic: Positive for environmental allergies. Negative for food allergies.  Neurological: Negative for headaches.   Objective: BP 110/86   Pulse 82   Temp 98 F (36.7 C) (Temporal)   Resp 16   Ht  (1.6 m)   Wt 125 lb (56.7 kg)   SpO2 98%   BMI 22.14 kg/m  Body mass index is 22.14 kg/m. Physical Exam  Constitutional: She is oriented to person, place, and time. She appears well-developed and well-nourished.  HENT:  Head: Normocephalic and atraumatic.  Right Ear: External ear normal.  Left Ear: External ear normal.  Nose: Nose normal.  Mouth/Throat: Oropharynx is clear and moist.  Eyes: Conjunctivae and EOM are normal.  Neck: Neck supple.  Cardiovascular: Normal rate, regular rhythm and normal heart sounds. Exam reveals no gallop and no friction rub.  No murmur heard. Pulmonary/Chest: Effort normal and breath sounds normal. She has no wheezes. She has no rales.  Abdominal: Soft.  Neurological: She is alert and oriented to person, place, and time.  Skin: Skin is warm. No rash noted.  Psychiatric: She has a normal mood and affect. Her behavior is normal.  Nursing note and vitals reviewed.  The plan was reviewed with the patient/family, and all questions/concerned were addressed.  It was my pleasure to see Teresa Wheeler today and participate in her care. Please feel free to contact me with any questions or concerns.  Sincerely,  Wyline Mood, DO Allergy & Immunology  [facility] office: [phone] [facility] office: [phone] [place] office: [phone]

## 2019-12-05 ENCOUNTER — Other Ambulatory Visit: Payer: Self-pay

## 2019-12-05 ENCOUNTER — Ambulatory Visit
Admission: RE | Admit: 2019-12-05 | Discharge: 2019-12-05 | Disposition: A | Discharge status: Home / Self Care | Payer: BLUE CROSS/BLUE SHIELD | Source: Ambulatory Visit | Attending: Family Medicine | Admitting: Family Medicine

## 2019-12-05 DIAGNOSIS — Z1231 Encounter for screening mammogram for malignant neoplasm of breast: Secondary | ICD-10-CM | POA: Diagnosis not present

## 2020-01-04 DIAGNOSIS — Z1159 Encounter for screening for other viral diseases: Secondary | ICD-10-CM | POA: Diagnosis not present

## 2020-01-05 DIAGNOSIS — Z1159 Encounter for screening for other viral diseases: Secondary | ICD-10-CM | POA: Diagnosis not present

## 2020-02-27 ENCOUNTER — Ambulatory Visit: Payer: BC Managed Care – PPO | Admitting: Allergy

## 2020-02-27 ENCOUNTER — Encounter: Payer: Self-pay | Admitting: Allergy

## 2020-02-27 ENCOUNTER — Other Ambulatory Visit: Payer: Self-pay

## 2020-02-27 VITALS — BP 118/84 | HR 77 | Temp 98.0°F | Resp 16

## 2020-02-27 DIAGNOSIS — H1013 Acute atopic conjunctivitis, bilateral: Secondary | ICD-10-CM | POA: Diagnosis not present

## 2020-02-27 DIAGNOSIS — J3089 Other allergic rhinitis: Secondary | ICD-10-CM

## 2020-02-27 MED ORDER — AZELASTINE HCL 0.1 % NA SOLN: NASAL | 5 refills | Status: DC

## 2020-02-27 NOTE — Patient Instructions (Addendum)
Allergic rhino conjunctivitis.   Past skin testing showed: Positive to grass pollen, tree pollen and borderline to ragweed pollen.   Continue environmental control measures.  Use Astelin nasal spray 1-2 sprays per nostril 1-2 times a day as needed for drainage.  May use Nasacort 1 spray per nostril twice a day as needed for nasal congestion.  May use over the counter antihistamines such as Zyrtec (cetirizine), Claritin (loratadine), Allegra (fexofenadine), or Xyzal (levocetirizine) daily as needed.  May use olopatadine 0.1% 1 drop in each eye twice daily as needed for itchy/watery eyes.   Follow up in 3 months or sooner if needed.   Reducing Pollen Exposure . Pollen seasons: trees (spring), grass (summer) and ragweed/weeds (fall). Marland Kitchen Keep windows closed in your home and car to lower pollen exposure.  Lilian Kapur air conditioning in the bedroom and throughout the house if possible.  . Avoid going out in dry windy days - especially early morning. . Pollen counts are highest between 5 - 10 AM and on dry, hot and windy days.  . Save outside activities for late afternoon or after a heavy rain, when pollen levels are lower.  . Avoid mowing of grass if you have grass pollen allergy. Marland Kitchen Be aware that pollen can also be transported indoors on people and pets.  . Dry your clothes in an automatic dryer rather than hanging them outside where they might collect pollen.  . Rinse hair and eyes before bedtime.

## 2020-02-27 NOTE — Assessment & Plan Note (Signed)
   See assessment and plan as above allergic rhinitis. 

## 2020-02-27 NOTE — Progress Notes (Signed)
Follow Up Note  RE: Teresa Wheeler MRN: [id number] DOB: 1976/07/09 Date of Office Visit: 02/27/2020  Referring provider: Marda Stalker, PA-C Primary care provider: Marda Stalker, PA-C   Chief Complaint: Follow-up and Nasal Congestion  History of Present Illness: I had the pleasure of seeing Teresa Wheeler for a follow up visit at the [facility] on 02/27/2020. She is a 44 y.o. female, who is being followed for allergic rhinoconjunctivitis. Her previous allergy office visit was on 11/23/2019 with Dr. Maudie Mercury. Today is a regular follow up visit.  Allergic rhino conjunctivitis Has some nasal drainage and uses azelastine nasal spray with good benefit. No nosebleeds. Does not use Nasacort. Takes Claritin as needed and eye drops as needed.   Assessment and Plan: Teresa Wheeler is a 44 y.o. female with: Other allergic rhinitis Past history - Perennial rhinoconjunctivitis symptoms which worsens when being outdoors.  Using Claritin and Flonase as needed with some benefit.  Singulair caused drowsiness.  No ENT evaluation. 2020 skin testing showed: Positive to grass pollen, tree pollen and borderline to ragweed pollen.  Interim history - Using below regimen as needed with good benefit. Not interested in allergy injections at this time.   Continue environmental control measures.  Use Astelin nasal spray 1-2 sprays per nostril 1-2 times a day as needed for drainage.  May use Nasacort 1 spray per nostril twice a day as needed for nasal congestion.  May use over the counter antihistamines such as Zyrtec (cetirizine), Claritin (loratadine), Allegra (fexofenadine), or Xyzal (levocetirizine) daily as needed.  May use olopatadine 0.1% 1 drop in each eye twice daily as needed for itchy/watery eyes.   If above regimen does not control symptoms then will discuss starting allergy immunotherapy in more detail at next visit.   Allergic conjunctivitis of both eyes  See assessment and plan as  above allergic rhinitis.  Return in about 3 months (around 05/29/2020).  Meds ordered this encounter  Medications  . azelastine (ASTELIN) 0.1 % nasal spray    Sig: May use 1-2 sprays per nostril 1-2 times a day as needed for drainage.    Dispense:  30 mL    Refill:  5   Diagnostics: None.  Medication List:  Current Outpatient Medications  Medication Sig Dispense Refill  . azelastine (ASTELIN) 0.1 % nasal spray May use 1-2 sprays per nostril 1-2 times a day as needed for drainage. 30 mL 5  . cholecalciferol (VITAMIN D3) 25 MCG (1000 UT) tablet Take 1,000 Units by mouth daily.    Marland Kitchen ibuprofen (ADVIL) 600 MG tablet Take 1 tablet (600 mg total) by mouth every 8 (eight) hours as needed. 60 tablet 1  . Loratadine (CLARITIN PO) Take 1 tablet by mouth daily.    Marland Kitchen olopatadine (PATANOL) 0.1 % ophthalmic solution Place 1 drop into both eyes 2 (two) times daily as needed for allergies (itchy/watery eyes). 5 mL 5  . triamcinolone (NASACORT) 55 MCG/ACT AERO nasal inhaler Place 1 spray into the nose 2 (two) times daily. For nasal congestion. 1 Inhaler 5   No current facility-administered medications for this visit.   Allergies: Allergies  Allergen Reactions  . Other     ENVIRONMETAL   I reviewed her past medical history, social history, family history, and environmental history and no significant changes have been reported from her previous visit.  Review of Systems  Constitutional: Negative for appetite change, chills, fever and unexpected weight change.  HENT: Positive for postnasal drip and rhinorrhea. Negative for congestion.  Eyes: Negative for itching.  Respiratory: Negative for cough, chest tightness, shortness of breath and wheezing.   Cardiovascular: Negative for chest pain.  Gastrointestinal: Negative for abdominal pain.  Genitourinary: Negative for difficulty urinating.  Skin: Negative for rash.  Allergic/Immunologic: Positive for environmental allergies. Negative for food  allergies.  Neurological: Negative for headaches.   Objective: BP 118/84 (BP Location: Right Arm, Patient Position: Sitting, Cuff Size: Normal)   Pulse 77   Temp 98 F (36.7 C) (Temporal)   Resp 16   SpO2 97%  There is no height or weight on file to calculate BMI. Physical Exam  Constitutional: She is oriented to person, place, and time. She appears well-developed and well-nourished.  HENT:  Head: Normocephalic and atraumatic.  Right Ear: External ear normal.  Left Ear: External ear normal.  Nose: Nose normal.  Mouth/Throat: Oropharynx is clear and moist.  Eyes: Conjunctivae and EOM are normal.  Cardiovascular: Normal rate, regular rhythm and normal heart sounds. Exam reveals no gallop and no friction rub.  No murmur heard. Pulmonary/Chest: Effort normal and breath sounds normal. She has no wheezes. She has no rales.  Abdominal: Soft.  Musculoskeletal:     Cervical back: Neck supple.  Neurological: She is alert and oriented to person, place, and time.  Skin: Skin is warm. No rash noted.  Psychiatric: She has a normal mood and affect. Her behavior is normal.  Nursing note and vitals reviewed.  Previous notes and tests were reviewed. The plan was reviewed with the patient/family, and all questions/concerned were addressed.  It was my pleasure to see Mahalia today and participate in her care. Please feel free to contact me with any questions or concerns.  Sincerely,  Wyline Mood, DO Allergy & Immunology  [facility] office: [phone] [facility] office: [phone] [place] office: [phone]

## 2020-02-27 NOTE — Assessment & Plan Note (Signed)
Past history - Perennial rhinoconjunctivitis symptoms which worsens when being outdoors.  Using Claritin and Flonase as needed with some benefit.  Singulair caused drowsiness.  No ENT evaluation. 2020 skin testing showed: Positive to grass pollen, tree pollen and borderline to ragweed pollen.  Interim history - Using below regimen as needed with good benefit. Not interested in allergy injections at this time.   Continue environmental control measures.  Use Astelin nasal spray 1-2 sprays per nostril 1-2 times a day as needed for drainage.  May use Nasacort 1 spray per nostril twice a day as needed for nasal congestion.  May use over the counter antihistamines such as Zyrtec (cetirizine), Claritin (loratadine), Allegra (fexofenadine), or Xyzal (levocetirizine) daily as needed.  May use olopatadine 0.1% 1 drop in each eye twice daily as needed for itchy/watery eyes.   If above regimen does not control symptoms then will discuss starting allergy immunotherapy in more detail at next visit.

## 2020-04-05 DIAGNOSIS — Z0000 Encounter for general adult medical examination without abnormal findings: Secondary | ICD-10-CM | POA: Diagnosis not present

## 2020-04-05 DIAGNOSIS — Z13220 Encounter for screening for lipoid disorders: Secondary | ICD-10-CM | POA: Diagnosis not present

## 2020-10-22 ENCOUNTER — Ambulatory Visit: Payer: BC Managed Care – PPO | Admitting: Nurse Practitioner

## 2020-10-22 ENCOUNTER — Encounter: Payer: Self-pay | Admitting: Nurse Practitioner

## 2020-10-22 ENCOUNTER — Other Ambulatory Visit: Payer: Self-pay

## 2020-10-22 VITALS — BP 120/80 | Ht 63.0 in | Wt 127.8 lb

## 2020-10-22 DIAGNOSIS — E785 Hyperlipidemia, unspecified: Secondary | ICD-10-CM | POA: Diagnosis not present

## 2020-10-22 DIAGNOSIS — Z01419 Encounter for gynecological examination (general) (routine) without abnormal findings: Secondary | ICD-10-CM | POA: Diagnosis not present

## 2020-10-22 NOTE — Progress Notes (Signed)
   Teresa Wheeler [birth year] [id number]   History:  44 y.o. G1P1 presents for annual exam. Regular monthly cycle. Not sexually active. Normal pap and mammogram history.   Gynecologic History Patient's last menstrual period was 10/05/2020. Period Duration (Days): 3-4 DAYS Period Pattern: Regular Menstrual Flow: Moderate Menstrual Control: Thin pad Menstrual Control Change Freq (Hours): EVERY 4 HOURS Dysmenorrhea: None Contraception: abstinence Last Pap: 06/24/2016. Results were: normal Last mammogram: 12/05/2019. Results were: normal   Past medical history, past surgical history, family history and social history were all reviewed and documented in the EPIC chart.  ROS:  A ROS was performed and pertinent positives and negatives are included.  Exam:  Vitals:   10/22/20 1037  BP: 120/80  Weight: 127 lb 12.8 oz (58 kg)  Height: 5\' 3"  (1.6 m)   Body mass index is 22.64 kg/m.  General appearance:  Normal Thyroid:  Symmetrical, normal in size, without palpable masses or nodularity. Respiratory  Auscultation:  Clear without wheezing or rhonchi Cardiovascular  Auscultation:  Regular rate, without rubs, murmurs or gallops  Edema/varicosities:  Not grossly evident Abdominal  Soft,nontender, without masses, guarding or rebound.  Liver/spleen:  No organomegaly noted  Hernia:  None appreciated  Skin  Inspection:  Grossly normal   Breasts: Examined lying and sitting.   Right: Without masses, retractions, discharge or axillary adenopathy.   Left: Without masses, retractions, discharge or axillary adenopathy. Gentitourinary   Inguinal/mons:  Normal without inguinal adenopathy  External genitalia:  Normal  BUS/Urethra/Skene's glands:  Normal  Vagina:  Normal  Cervix:  Normal  Uterus:  Anteverted, normal in size, shape and contour.  Midline and mobile  Adnexa/parametria:     Rt: Without masses or tenderness.   Lt: Without masses or tenderness.  Anus and  perineum: Normal  Assessment/Plan:  44 y.o. G1P1 for annual exam.   Well woman exam with routine gynecological exam - Plan: CBC with Differential/Platelet, Comprehensive metabolic panel, Lipid panel. Education provided on SBEs, importance of preventative screenings, current guidelines, high calcium diet, regular exercise, and multivitamin daily.   Screening for cervical cancer - Normal pap history. Pap with HPV typing today.  Screening for breast cancer - Normal mammogram history. Normal breast exam today.   Follow up in 1 year for annual        [facility], 10:40 AM 10/22/2020

## 2020-10-22 NOTE — Addendum Note (Signed)
Addended by: Aura Camps on: 10/22/2020 10:55 AM   Modules accepted: Orders

## 2020-10-22 NOTE — Patient Instructions (Signed)
Health Maintenance, Female Adopting a healthy lifestyle and getting preventive care are important in promoting health and wellness. Ask your health care provider about:  The right schedule for you to have regular tests and exams.  Things you can do on your own to prevent diseases and keep yourself healthy. What should I know about diet, weight, and exercise? Eat a healthy diet   Eat a diet that includes plenty of vegetables, fruits, low-fat dairy products, and lean protein.  Do not eat a lot of foods that are high in solid fats, added sugars, or sodium. Maintain a healthy weight Body mass index (BMI) is used to identify weight problems. It estimates body fat based on height and weight. Your health care provider can help determine your BMI and help you achieve or maintain a healthy weight. Get regular exercise Get regular exercise. This is one of the most important things you can do for your health. Most adults should:  Exercise for at least 150 minutes each week. The exercise should increase your heart rate and make you sweat (moderate-intensity exercise).  Do strengthening exercises at least twice a week. This is in addition to the moderate-intensity exercise.  Spend less time sitting. Even light physical activity can be beneficial. Watch cholesterol and blood lipids Have your blood tested for lipids and cholesterol at 45 years of age, then have this test every 5 years. Have your cholesterol levels checked more often if:  Your lipid or cholesterol levels are high.  You are older than 45 years of age.  You are at high risk for heart disease. What should I know about cancer screening? Depending on your health history and family history, you may need to have cancer screening at various ages. This may include screening for:  Breast cancer.  Cervical cancer.  Colorectal cancer.  Skin cancer.  Lung cancer. What should I know about heart disease, diabetes, and high blood  pressure? Blood pressure and heart disease  High blood pressure causes heart disease and increases the risk of stroke. This is more likely to develop in people who have high blood pressure readings, are of African descent, or are overweight.  Have your blood pressure checked: ? Every 3-5 years if you are 18-39 years of age. ? Every year if you are 40 years old or older. Diabetes Have regular diabetes screenings. This checks your fasting blood sugar level. Have the screening done:  Once every three years after age 40 if you are at a normal weight and have a low risk for diabetes.  More often and at a younger age if you are overweight or have a high risk for diabetes. What should I know about preventing infection? Hepatitis B If you have a higher risk for hepatitis B, you should be screened for this virus. Talk with your health care provider to find out if you are at risk for hepatitis B infection. Hepatitis C Testing is recommended for:  Everyone born from 1945 through 1965.  Anyone with known risk factors for hepatitis C. Sexually transmitted infections (STIs)  Get screened for STIs, including gonorrhea and chlamydia, if: ? You are sexually active and are younger than 45 years of age. ? You are older than 45 years of age and your health care provider tells you that you are at risk for this type of infection. ? Your sexual activity has changed since you were last screened, and you are at increased risk for chlamydia or gonorrhea. Ask your health care provider if   you are at risk.  Ask your health care provider about whether you are at high risk for HIV. Your health care provider may recommend a prescription medicine to help prevent HIV infection. If you choose to take medicine to prevent HIV, you should first get tested for HIV. You should then be tested every 3 months for as long as you are taking the medicine. Pregnancy  If you are about to stop having your period (premenopausal) and  you may become pregnant, seek counseling before you get pregnant.  Take 400 to 800 micrograms (mcg) of folic acid every day if you become pregnant.  Ask for birth control (contraception) if you want to prevent pregnancy. Osteoporosis and menopause Osteoporosis is a disease in which the bones lose minerals and strength with aging. This can result in bone fractures. If you are 65 years old or older, or if you are at risk for osteoporosis and fractures, ask your health care provider if you should:  Be screened for bone loss.  Take a calcium or vitamin D supplement to lower your risk of fractures.  Be given hormone replacement therapy (HRT) to treat symptoms of menopause. Follow these instructions at home: Lifestyle  Do not use any products that contain nicotine or tobacco, such as cigarettes, e-cigarettes, and chewing tobacco. If you need help quitting, ask your health care provider.  Do not use street drugs.  Do not share needles.  Ask your health care provider for help if you need support or information about quitting drugs. Alcohol use  Do not drink alcohol if: ? Your health care provider tells you not to drink. ? You are pregnant, may be pregnant, or are planning to become pregnant.  If you drink alcohol: ? Limit how much you use to 0-1 drink a day. ? Limit intake if you are breastfeeding.  Be aware of how much alcohol is in your drink. In the [place] one drink equals one 12 oz bottle of beer (355 mL), one 5 oz glass of wine (148 mL), or one 1 oz glass of hard liquor (44 mL). General instructions  Schedule regular health, dental, and eye exams.  Stay current with your vaccines.  Tell your health care provider if: ? You often feel depressed. ? You have ever been abused or do not feel safe at home. Summary  Adopting a healthy lifestyle and getting preventive care are important in promoting health and wellness.  Follow your health care provider's instructions about healthy  diet, exercising, and getting tested or screened for diseases.  Follow your health care provider's instructions on monitoring your cholesterol and blood pressure. This information is not intended to replace advice given to you by your health care provider. Make sure you discuss any questions you have with your health care provider. Document Revised: 12/08/2018 Document Reviewed: 12/08/2018 Elsevier Patient Education  2020 Elsevier Inc.  

## 2020-10-23 LAB — CBC WITH DIFFERENTIAL/PLATELET
Absolute Monocytes: 540 cells/uL (ref 200–950)
Basophils Absolute: 30 cells/uL (ref 0–200)
Basophils Relative: 0.6 %
Eosinophils Absolute: 60 cells/uL (ref 15–500)
Eosinophils Relative: 1.2 %
HCT: 41.8 % (ref 35.0–45.0)
Hemoglobin: 13.7 g/dL (ref 11.7–15.5)
Lymphs Abs: 2105 cells/uL (ref 850–3900)
MCH: 29.8 pg (ref 27.0–33.0)
MCHC: 32.8 g/dL (ref 32.0–36.0)
MCV: 90.9 fL (ref 80.0–100.0)
MPV: 9.8 fL (ref 7.5–12.5)
Monocytes Relative: 10.8 %
Neutro Abs: 2265 cells/uL (ref 1500–7800)
Neutrophils Relative %: 45.3 %
Platelets: 297 10*3/uL (ref 140–400)
RBC: 4.6 10*6/uL (ref 3.80–5.10)
RDW: 12.1 % (ref 11.0–15.0)
Total Lymphocyte: 42.1 %
WBC: 5 10*3/uL (ref 3.8–10.8)

## 2020-10-23 LAB — COMPREHENSIVE METABOLIC PANEL
AG Ratio: 1.6 (calc) (ref 1.0–2.5)
ALT: 16 U/L (ref 6–29)
AST: 18 U/L (ref 10–30)
Albumin: 4.3 g/dL (ref 3.6–5.1)
Alkaline phosphatase (APISO): 44 U/L (ref 31–125)
BUN: 10 mg/dL (ref 7–25)
CO2: 27 mmol/L (ref 20–32)
Calcium: 9.4 mg/dL (ref 8.6–10.2)
Chloride: 102 mmol/L (ref 98–110)
Creat: 0.6 mg/dL (ref 0.50–1.10)
Globulin: 2.7 g/dL (calc) (ref 1.9–3.7)
Glucose, Bld: 87 mg/dL (ref 65–99)
Potassium: 4.4 mmol/L (ref 3.5–5.3)
Sodium: 139 mmol/L (ref 135–146)
Total Bilirubin: 0.4 mg/dL (ref 0.2–1.2)
Total Protein: 7 g/dL (ref 6.1–8.1)

## 2020-10-23 LAB — LIPID PANEL
Cholesterol: 193 mg/dL (ref <200)
HDL: 64 mg/dL (ref >=50)
LDL Cholesterol (Calc): 110 mg/dL (calc) — ABNORMAL HIGH
Non-HDL Cholesterol (Calc): 129 mg/dL (calc) (ref <130)
Total CHOL/HDL Ratio: 3 (calc) (ref <5.0)
Triglycerides: 101 mg/dL (ref <150)

## 2020-10-25 LAB — PAP, TP IMAGING W/ HPV RNA, RFLX HPV TYPE 16,18/45: HPV DNA High Risk: NOT DETECTED

## 2020-10-26 ENCOUNTER — Other Ambulatory Visit: Payer: Self-pay | Admitting: Family Medicine

## 2020-10-26 DIAGNOSIS — Z1231 Encounter for screening mammogram for malignant neoplasm of breast: Secondary | ICD-10-CM

## 2020-12-05 ENCOUNTER — Ambulatory Visit
Admission: RE | Admit: 2020-12-05 | Discharge: 2020-12-05 | Disposition: A | Discharge status: Home / Self Care | Payer: BC Managed Care – PPO | Source: Ambulatory Visit | Attending: Family Medicine | Admitting: Family Medicine

## 2020-12-05 ENCOUNTER — Other Ambulatory Visit: Payer: Self-pay

## 2020-12-05 DIAGNOSIS — Z1231 Encounter for screening mammogram for malignant neoplasm of breast: Secondary | ICD-10-CM

## 2020-12-10 ENCOUNTER — Other Ambulatory Visit: Payer: Self-pay | Admitting: Family Medicine

## 2020-12-10 DIAGNOSIS — R928 Other abnormal and inconclusive findings on diagnostic imaging of breast: Secondary | ICD-10-CM

## 2021-01-07 DIAGNOSIS — Z20828 Contact with and (suspected) exposure to other viral communicable diseases: Secondary | ICD-10-CM | POA: Diagnosis not present

## 2021-01-07 DIAGNOSIS — U071 COVID-19: Secondary | ICD-10-CM | POA: Diagnosis not present

## 2021-01-21 ENCOUNTER — Other Ambulatory Visit: Payer: BC Managed Care – PPO

## 2021-02-20 ENCOUNTER — Other Ambulatory Visit: Payer: BC Managed Care – PPO

## 2021-03-06 ENCOUNTER — Other Ambulatory Visit: Payer: Self-pay

## 2021-03-06 ENCOUNTER — Ambulatory Visit
Admission: RE | Admit: 2021-03-06 | Discharge: 2021-03-06 | Disposition: A | Discharge status: Home / Self Care | Payer: BC Managed Care – PPO | Source: Ambulatory Visit | Attending: Family Medicine | Admitting: Family Medicine

## 2021-03-06 DIAGNOSIS — R922 Inconclusive mammogram: Secondary | ICD-10-CM | POA: Diagnosis not present

## 2021-03-06 DIAGNOSIS — N6001 Solitary cyst of right breast: Secondary | ICD-10-CM | POA: Diagnosis not present

## 2021-03-06 DIAGNOSIS — R928 Other abnormal and inconclusive findings on diagnostic imaging of breast: Secondary | ICD-10-CM

## 2021-10-23 ENCOUNTER — Ambulatory Visit: Payer: Self-pay | Admitting: Nurse Practitioner

## 2021-10-23 ENCOUNTER — Encounter: Payer: BC Managed Care – PPO | Admitting: Nurse Practitioner

## 2021-11-27 ENCOUNTER — Encounter: Payer: Self-pay | Admitting: Nurse Practitioner

## 2021-11-27 ENCOUNTER — Other Ambulatory Visit: Payer: Self-pay

## 2021-11-27 ENCOUNTER — Ambulatory Visit (INDEPENDENT_AMBULATORY_CARE_PROVIDER_SITE_OTHER): Payer: BC Managed Care – PPO | Admitting: Nurse Practitioner

## 2021-11-27 VITALS — BP 124/82 | Ht 62.0 in | Wt 130.0 lb

## 2021-11-27 DIAGNOSIS — Z01419 Encounter for gynecological examination (general) (routine) without abnormal findings: Secondary | ICD-10-CM | POA: Diagnosis not present

## 2021-11-27 NOTE — Progress Notes (Signed)
   Teresa Wheeler March 27, 1976 [id number]   History:  45 y.o. G1P1 presents for annual exam without GYN complaints. Regular monthly cycle. Normal pap and mammogram history.   Gynecologic History Patient's last menstrual period was 11/20/2021. Period Cycle (Days): 28 Period Duration (Days): 4 Period Pattern: Regular Menstrual Flow: Light Dysmenorrhea: None Contraception: abstinence Sexually active: No  Health maintenance Last Pap: 10/22/2020. Results were: Normal, 5-year repeat Last mammogram: 03/06/2021 Results were: Normal Last colonoscopy: Never Last Dexa:Not indicated  Past medical history, past surgical history, family history and social history were all reviewed and documented in the EPIC chart. Widowed. Husband passed at age 54 from heart attack. 65 yo daughter. From [place], 34 yo mother lives there. Works remote in Audiological scientist.   ROS:  A ROS was performed and pertinent positives and negatives are included.  Exam:  Vitals:   11/27/21 1145  BP: 124/82  Weight: 130 lb (59 kg)  Height: 5\' 2"  (1.575 m)   Body mass index is 23.78 kg/m.  General appearance:  Normal Thyroid:  Symmetrical, normal in size, without palpable masses or nodularity. Respiratory  Auscultation:  Clear without wheezing or rhonchi Cardiovascular  Auscultation:  Regular rate, without rubs, murmurs or gallops  Edema/varicosities:  Not grossly evident Abdominal  Soft,nontender, without masses, guarding or rebound.  Liver/spleen:  No organomegaly noted  Hernia:  None appreciated  Skin  Inspection:  Grossly normal   Breasts: Examined lying and sitting.   Right: Without masses, retractions, discharge or axillary adenopathy.   Left: Without masses, retractions, discharge or axillary adenopathy. Genitourinary   Inguinal/mons:  Normal without inguinal adenopathy  External genitalia:  Normal appearing vulva with no masses, tenderness, or lesions  BUS/Urethra/Skene's glands:  Normal  Vagina:  Normal  appearing with normal color and discharge, no lesions  Cervix:  Normal appearing without discharge or lesions  Uterus:  Normal in size, shape and contour.  Midline and mobile, nontender  Adnexa/parametria:     Rt: Normal in size, without masses or tenderness.   Lt: Normal in size, without masses or tenderness.  Anus and perineum: Normal, non-bleeding hemorrhoid  Digital rectal exam: Normal sphincter tone without palpated masses or tenderness  Patient informed chaperone available to be present for breast and pelvic exam. Patient has requested no chaperone to be present. Patient has been advised what will be completed during breast and pelvic exam.   Assessment/Plan:  45 y.o. G1P1 for annual exam.   Well female exam with routine gynecological exam - Plan: CBC with Differential/Platelet, Comprehensive metabolic panel. Education provided on SBEs, importance of preventative screenings, current guidelines, high calcium diet, regular exercise, and multivitamin daily.   Screening for cervical cancer - Normal Pap history.  Will repeat at 5-year interval per guidelines.  Screening for breast cancer - Normal mammogram history.  Continue annual screenings.  Normal breast exam today.  Screening for colon cancer - Discussed new changes to guidelines and recommendatons to start screenings at age 51. Information provided on [facility].   Follow up in 1 year for annual.        [facility], 12:05 PM 11/27/2021

## 2021-11-27 NOTE — Patient Instructions (Signed)
Schedule Colonoscopy! ?[facility] ?[phone] ?[address] [place], [place] [address] ? ?

## 2021-11-28 LAB — COMPREHENSIVE METABOLIC PANEL
AG Ratio: 1.7 (calc) (ref 1.0–2.5)
ALT: 18 U/L (ref 6–29)
AST: 18 U/L (ref 10–35)
Albumin: 4.3 g/dL (ref 3.6–5.1)
Alkaline phosphatase (APISO): 50 U/L (ref 31–125)
BUN: 8 mg/dL (ref 7–25)
CO2: 28 mmol/L (ref 20–32)
Calcium: 9.3 mg/dL (ref 8.6–10.2)
Chloride: 105 mmol/L (ref 98–110)
Creat: 0.62 mg/dL (ref 0.50–0.99)
Globulin: 2.6 g/dL (calc) (ref 1.9–3.7)
Glucose, Bld: 74 mg/dL (ref 65–99)
Potassium: 3.9 mmol/L (ref 3.5–5.3)
Sodium: 142 mmol/L (ref 135–146)
Total Bilirubin: 0.3 mg/dL (ref 0.2–1.2)
Total Protein: 6.9 g/dL (ref 6.1–8.1)

## 2021-11-28 LAB — CBC WITH DIFFERENTIAL/PLATELET
Absolute Monocytes: 489 cells/uL (ref 200–950)
Basophils Absolute: 21 cells/uL (ref 0–200)
Basophils Relative: 0.4 %
Eosinophils Absolute: 88 cells/uL (ref 15–500)
Eosinophils Relative: 1.7 %
HCT: 39.5 % (ref 35.0–45.0)
Hemoglobin: 13.5 g/dL (ref 11.7–15.5)
Lymphs Abs: 2678 cells/uL (ref 850–3900)
MCH: 30.8 pg (ref 27.0–33.0)
MCHC: 34.2 g/dL (ref 32.0–36.0)
MCV: 90 fL (ref 80.0–100.0)
MPV: 9.7 fL (ref 7.5–12.5)
Monocytes Relative: 9.4 %
Neutro Abs: 1924 cells/uL (ref 1500–7800)
Neutrophils Relative %: 37 %
Platelets: 324 10*3/uL (ref 140–400)
RBC: 4.39 10*6/uL (ref 3.80–5.10)
RDW: 12 % (ref 11.0–15.0)
Total Lymphocyte: 51.5 %
WBC: 5.2 10*3/uL (ref 3.8–10.8)

## 2022-01-31 ENCOUNTER — Other Ambulatory Visit: Payer: Self-pay | Admitting: Family Medicine

## 2022-01-31 DIAGNOSIS — Z1231 Encounter for screening mammogram for malignant neoplasm of breast: Secondary | ICD-10-CM

## 2022-03-07 ENCOUNTER — Ambulatory Visit
Admission: RE | Admit: 2022-03-07 | Discharge: 2022-03-07 | Disposition: A | Discharge status: Home / Self Care | Payer: BC Managed Care – PPO | Source: Ambulatory Visit | Attending: Family Medicine | Admitting: Family Medicine

## 2022-03-07 ENCOUNTER — Other Ambulatory Visit: Payer: Self-pay

## 2022-03-07 DIAGNOSIS — Z1231 Encounter for screening mammogram for malignant neoplasm of breast: Secondary | ICD-10-CM | POA: Diagnosis not present

## 2022-05-06 DIAGNOSIS — Z0000 Encounter for general adult medical examination without abnormal findings: Secondary | ICD-10-CM | POA: Diagnosis not present

## 2022-05-06 DIAGNOSIS — Z13220 Encounter for screening for lipoid disorders: Secondary | ICD-10-CM | POA: Diagnosis not present

## 2022-05-06 DIAGNOSIS — Z23 Encounter for immunization: Secondary | ICD-10-CM | POA: Diagnosis not present

## 2022-09-22 DIAGNOSIS — L239 Allergic contact dermatitis, unspecified cause: Secondary | ICD-10-CM | POA: Diagnosis not present

## 2022-12-01 ENCOUNTER — Ambulatory Visit: Payer: BC Managed Care – PPO | Admitting: Nurse Practitioner

## 2022-12-01 NOTE — Progress Notes (Deleted)
   Teresa Wheeler 02/05/1976 [id number]   History:  46 y.o. G1P1 presents for annual exam without GYN complaints. Monthly cycles. Normal pap and mammogram history.   Gynecologic History No LMP recorded.   Contraception: abstinence Sexually active: No  Health maintenance Last Pap: 10/22/2020. Results were: Normal, 5-year repeat Last mammogram: 03/07/2022. Results were: Normal Last colonoscopy: Never Last Dexa:Not indicated  Past medical history, past surgical history, family history and social history were all reviewed and documented in the EPIC chart. Widowed. Husband passed at age 63 from heart attack. 76 yo daughter. From [place], 36 yo mother lives there. Works remote in Audiological scientist.   ROS:  A ROS was performed and pertinent positives and negatives are included.  Exam:  There were no vitals filed for this visit.  There is no height or weight on file to calculate BMI.  General appearance:  Normal Thyroid:  Symmetrical, normal in size, without palpable masses or nodularity. Respiratory  Auscultation:  Clear without wheezing or rhonchi Cardiovascular  Auscultation:  Regular rate, without rubs, murmurs or gallops  Edema/varicosities:  Not grossly evident Abdominal  Soft,nontender, without masses, guarding or rebound.  Liver/spleen:  No organomegaly noted  Hernia:  None appreciated  Skin  Inspection:  Grossly normal   Breasts: Examined lying and sitting.   Right: Without masses, retractions, discharge or axillary adenopathy.   Left: Without masses, retractions, discharge or axillary adenopathy. Genitourinary   Inguinal/mons:  Normal without inguinal adenopathy  External genitalia:  Normal appearing vulva with no masses, tenderness, or lesions  BUS/Urethra/Skene's glands:  Normal  Vagina:  Normal appearing with normal color and discharge, no lesions  Cervix:  Normal appearing without discharge or lesions  Uterus:  Normal in size, shape and contour.  Midline and mobile,  nontender  Adnexa/parametria:     Rt: Normal in size, without masses or tenderness.   Lt: Normal in size, without masses or tenderness.  Anus and perineum: Normal, non-bleeding hemorrhoid  Digital rectal exam: Normal sphincter tone without palpated masses or tenderness  Patient informed chaperone available to be present for breast and pelvic exam. Patient has requested no chaperone to be present. Patient has been advised what will be completed during breast and pelvic exam.   Assessment/Plan:  46 y.o. G1P1 for annual exam.   Well female exam with routine gynecological exam - Plan: CBC with Differential/Platelet, Comprehensive metabolic panel. Education provided on SBEs, importance of preventative screenings, current guidelines, high calcium diet, regular exercise, and multivitamin daily.   Screening for cervical cancer - Normal Pap history.  Will repeat at 5-year interval per guidelines.  Screening for breast cancer - Normal mammogram history.  Continue annual screenings.  Normal breast exam today.  Screening for colon cancer - Discussed new changes to guidelines and recommendatons to start screenings at age 66. Information provided on  GI.   Follow up in 1 year for annual.        Olivia Mackie [facility], 8:56 AM 12/01/2022

## 2022-12-25 DIAGNOSIS — R0981 Nasal congestion: Secondary | ICD-10-CM | POA: Diagnosis not present

## 2022-12-30 ENCOUNTER — Encounter: Payer: Self-pay | Admitting: Nurse Practitioner

## 2023-01-13 ENCOUNTER — Ambulatory Visit (INDEPENDENT_AMBULATORY_CARE_PROVIDER_SITE_OTHER): Payer: BC Managed Care – PPO | Admitting: Nurse Practitioner

## 2023-01-13 ENCOUNTER — Encounter: Payer: Self-pay | Admitting: Nurse Practitioner

## 2023-01-13 VITALS — BP 112/72 | HR 78 | Ht 62.5 in | Wt 130.0 lb

## 2023-01-13 DIAGNOSIS — Z01419 Encounter for gynecological examination (general) (routine) without abnormal findings: Secondary | ICD-10-CM

## 2023-01-13 DIAGNOSIS — Z1211 Encounter for screening for malignant neoplasm of colon: Secondary | ICD-10-CM

## 2023-01-13 DIAGNOSIS — E7800 Pure hypercholesterolemia, unspecified: Secondary | ICD-10-CM

## 2023-01-13 NOTE — Progress Notes (Signed)
   Teresa Wheeler 21-Aug-1976 [id number]   History:  47 y.o. G1P1 presents for annual exam without GYN complaints. Monthly cycles. Normal pap and mammogram history.   Gynecologic History Patient's last menstrual period was 12/26/2022 (exact date). Period Cycle (Days): 28 Period Duration (Days): 3-4 Menstrual Flow: Moderate Menstrual Control: Maxi pad Dysmenorrhea: (!) Mild Dysmenorrhea Symptoms: Cramping Contraception: abstinence Sexually active: No  Health maintenance Last Pap: 10/22/2020. Results were: Normal, 5-year repeat Last mammogram: 03/07/2022. Results were: Normal Last colonoscopy: Never Last Dexa:Not indicated  Past medical history, past surgical history, family history and social history were all reviewed and documented in the EPIC chart. Widowed. Husband passed at age 13 from heart attack. 41 yo daughter. From [place], 19 yo mother lives there. 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   ROS:  A ROS was performed and pertinent positives and negatives are included.  Exam:  Vitals:   01/13/23 0939  BP: 112/72  Pulse: 78  SpO2: 98%  Weight: 130 lb (59 kg)  Height: 5' 2.5" (1.588 m)    Body mass index is 23.4 kg/m.  General appearance:  Normal Thyroid:  Symmetrical, normal in size, without palpable masses or nodularity. Respiratory  Auscultation:  Clear without wheezing or rhonchi Cardiovascular  Auscultation:  Regular rate, without rubs, murmurs or gallops  Edema/varicosities:  Not grossly evident Abdominal  Soft,nontender, without masses, guarding or rebound.  Liver/spleen:  No organomegaly noted  Hernia:  None appreciated  Skin  Inspection:  Grossly normal   Breasts: Examined lying and sitting.   Right: Without masses, retractions, discharge or axillary adenopathy.   Left: Without masses, retractions, discharge or axillary adenopathy. Genitourinary   Inguinal/mons:  Normal without inguinal adenopathy  External genitalia:  Normal appearing vulva with no  masses, tenderness, or lesions  BUS/Urethra/Skene's glands:  Normal  Vagina:  Normal appearing with normal color and discharge, no lesions  Cervix:  Normal appearing without discharge or lesions  Uterus:  Normal in size, shape and contour.  Midline and mobile, nontender  Adnexa/parametria:     Rt: Normal in size, without masses or tenderness.   Lt: Normal in size, without masses or tenderness.  Anus and perineum: Normal, non-bleeding hemorrhoid  Digital rectal exam: Normal sphincter tone without palpated masses or tenderness  Patient informed chaperone available to be present for breast and pelvic exam. Patient has requested no chaperone to be present. Patient has been advised what will be completed during breast and pelvic exam.   Assessment/Plan:  47 y.o. G1P1 for annual exam.   Well female exam with routine gynecological exam - Plan: CBC with Differential/Platelet, Comprehensive metabolic panel. Education provided on SBEs, importance of preventative screenings, current guidelines, high calcium diet, regular exercise, and multivitamin daily.   Elevated LDL cholesterol level - Plan: Lipid panel  Screening for colon cancer - Plan: Cologuard. Discussed current guidelines and importance of preventative screenings. Cologuard versus colonoscopy discussed.   Screening for cervical cancer - Normal Pap history.  Will repeat at 5-year interval per guidelines.  Screening for breast cancer - Normal mammogram history.  Continue annual screenings.  Normal breast exam today.  Follow up in 1 year for annual.        Tamela Gammon [facility], 9:44 AM 01/13/2023

## 2023-01-26 DIAGNOSIS — Z1211 Encounter for screening for malignant neoplasm of colon: Secondary | ICD-10-CM | POA: Diagnosis not present

## 2023-02-04 ENCOUNTER — Encounter: Payer: Self-pay | Admitting: Gastroenterology

## 2023-02-04 ENCOUNTER — Telehealth: Payer: Self-pay

## 2023-02-04 DIAGNOSIS — R195 Other fecal abnormalities: Secondary | ICD-10-CM

## 2023-02-04 LAB — COLOGUARD: COLOGUARD: POSITIVE — AB

## 2023-02-04 NOTE — Telephone Encounter (Signed)
Amb referral order placed to [facility].  Routed to [facility] to follow.

## 2023-02-04 NOTE — Telephone Encounter (Signed)
Wallace, Tiffany A, NP  P [facility] Triage Needs referral for colonoscopy for + Cologuard. Thanks. 

## 2023-02-17 ENCOUNTER — Ambulatory Visit (AMBULATORY_SURGERY_CENTER): Payer: BC Managed Care – PPO

## 2023-02-17 ENCOUNTER — Encounter: Payer: Self-pay | Admitting: Gastroenterology

## 2023-02-17 VITALS — Ht 62.5 in | Wt 125.0 lb

## 2023-02-17 DIAGNOSIS — R195 Other fecal abnormalities: Secondary | ICD-10-CM

## 2023-02-17 MED ORDER — NA SULFATE-K SULFATE-MG SULF 17.5-3.13-1.6 GM/177ML PO SOLN: 1.0000 | Freq: Once | ORAL | 0 refills | Status: AC

## 2023-02-17 NOTE — Progress Notes (Signed)
No egg or soy allergy known to patient  No issues known to pt with past sedation with any surgeries or procedures Patient denies ever being told they had issues or difficulty with intubation  No FH of Malignant Hyperthermia Pt is not on diet pills Pt is not on  home 02  Pt is not on blood thinners  Pt denies issues with constipation  No A fib or A flutter Have any cardiac testing pending--no Pt instructed to use Singlecare.com or GoodRx for a price reduction on prep  Patient's chart reviewed by Osvaldo Angst CNRA prior to previsit and patient appropriate for the [facility].  Previsit completed and red dot placed by patient's name on their procedure day (on provider's schedule).   Interpreter Kendell Bane

## 2023-03-08 ENCOUNTER — Encounter: Payer: Self-pay | Admitting: Certified Registered Nurse Anesthetist

## 2023-03-09 ENCOUNTER — Ambulatory Visit (AMBULATORY_SURGERY_CENTER): Payer: BC Managed Care – PPO | Admitting: Gastroenterology

## 2023-03-09 ENCOUNTER — Encounter: Payer: Self-pay | Admitting: Gastroenterology

## 2023-03-09 VITALS — BP 129/84 | HR 77 | Temp 99.1°F | Resp 14 | Ht 62.5 in | Wt 125.0 lb

## 2023-03-09 DIAGNOSIS — D123 Benign neoplasm of transverse colon: Secondary | ICD-10-CM

## 2023-03-09 DIAGNOSIS — R195 Other fecal abnormalities: Secondary | ICD-10-CM | POA: Diagnosis not present

## 2023-03-09 DIAGNOSIS — D125 Benign neoplasm of sigmoid colon: Secondary | ICD-10-CM | POA: Diagnosis not present

## 2023-03-09 DIAGNOSIS — Z1211 Encounter for screening for malignant neoplasm of colon: Secondary | ICD-10-CM | POA: Diagnosis not present

## 2023-03-09 MED ORDER — SODIUM CHLORIDE 0.9 % IV SOLN: 500.0000 mL | INTRAVENOUS | Status: DC

## 2023-03-09 NOTE — Progress Notes (Signed)
Pt's states no medical or surgical changes since previsit or office visit. 

## 2023-03-09 NOTE — Op Note (Signed)
[place] Patient Name: Teresa Wheeler Procedure Date: 03/09/2023 2:41 PM MRN: [id number] Endoscopist: Thornton Park MD, MD, [id number] Age: 47 Referring MD:  Date of Birth: July 17, 1976 Gender: Female Account #: [id number] Procedure:                Colonoscopy Indications:              Positive Cologuard test                           No known family history of colon cancer or polyps Medicines:                Monitored Anesthesia Care Procedure:                Pre-Anesthesia Assessment:                           - Prior to the procedure, a History and Physical                            was performed, and patient medications and                            allergies were reviewed. The patient's tolerance of                            previous anesthesia was also reviewed. The risks                            and benefits of the procedure and the sedation                            options and risks were discussed with the patient.                            All questions were answered, and informed consent                            was obtained. Prior Anticoagulants: The patient has                            taken no anticoagulant or antiplatelet agents. ASA                            Grade Assessment: II - A patient with mild systemic                            disease. After reviewing the risks and benefits,                            the patient was deemed in satisfactory condition to                            undergo the procedure.  After obtaining informed consent, the colonoscope                            was passed under direct vision. Throughout the                            procedure, the patient's blood pressure, pulse, and                            oxygen saturations were monitored continuously. The                            Olympus SN [id number] was introduced through the anus                            and advanced to the 3 cm into  the ileum. A second                            forward view of the right colon was performed. The                            colonoscopy was performed without difficulty. The                            patient tolerated the procedure well. The quality                            of the bowel preparation was good. The terminal                            ileum, ileocecal valve, appendiceal orifice, and                            rectum were photographed. Scope In: 3:07:32 PM Scope Out: 3:28:41 PM Scope Withdrawal Time: 0 hours 15 minutes 57 seconds  Total Procedure Duration: 0 hours 21 minutes 9 seconds  Findings:                 The perianal and digital rectal examinations were                            normal.                           Non-bleeding internal hemorrhoids were found.                           Two sessile polyps were found in the sigmoid colon.                            The polyps were 2 to 4 mm in size. These polyps                            were removed with a cold snare. Resection  was                            complete. Only one polyp was retrieved. Estimated                            blood loss was minimal.                           A 2 mm polyp was found in the transverse colon. The                            polyp was sessile. The polyp was removed with a                            cold snare. Resection and retrieval were complete.                            Estimated blood loss was minimal.                           The exam was otherwise without abnormality on                            direct and retroflexion views. Complications:            No immediate complications. Estimated Blood Loss:     Estimated blood loss was minimal. Impression:               - Non-bleeding internal hemorrhoids.                           - Two 1 to 3 mm polyps in the sigmoid colon,                            removed with a cold snare. Resected and retrieved.                            - One 2 mm polyp in the transverse colon, removed                            with a cold snare. Resected and retrieved.                           - The examination was otherwise normal on direct                            and retroflexion views. Recommendation:           - Patient has a contact number available for                            emergencies. The signs and symptoms of potential  delayed complications were discussed with the                            patient. Return to normal activities tomorrow.                            Written discharge instructions were provided to the                            patient.                           - Resume previous diet.                           - Continue present medications.                           - Await pathology results.                           - Repeat colonoscopy date to be determined after                            pending pathology results are reviewed for                            surveillance.                           - Emerging evidence supports eating a diet of                            fruits, vegetables, grains, calcium, and yogurt                            while reducing red meat and alcohol may reduce the                            risk of colon cancer.                           - Thank you for allowing me to be involved in your                            colon cancer prevention. Thornton Park MD, MD 03/09/2023 3:35:46 PM This report has been signed electronically.

## 2023-03-09 NOTE — Progress Notes (Signed)
Report given to PACU, vss 

## 2023-03-09 NOTE — Progress Notes (Signed)
   Referring Provider: Tamela Gammon, NP Primary Care Physician:  Marda Stalker, PA-C  Indication for Colonoscopy:  Positive Cologuard  IMPRESSION:  Positive Cologuard Appropriate candidate for monitored anesthesia care  PLAN: Colonoscopy in the [facility] today   HPI: Teresa Wheeler is a 47 y.o. female presents for colonoscopy to evaluate a positive Cologuard.  No known family history of colon cancer or polyps. No family history of uterine/endometrial cancer, pancreatic cancer or gastric/stomach cancer.   Past Medical History:  Diagnosis Date   Chlamydia 03/2010    No past surgical history on file.  Current Outpatient Medications  Medication Sig Dispense Refill   Loratadine (CLARITIN PO) Take 1 tablet by mouth daily.     Multiple Vitamin (MULTI-VITAMIN PO) Take by mouth.     Current Facility-Administered Medications  Medication Dose Route Frequency Provider Last Rate Last Admin   0.9 %  sodium chloride infusion  500 mL Intravenous Continuous Thornton Park, MD        Allergies as of 03/09/2023 - Review Complete 03/09/2023  Allergen Reaction Noted   Other  05/31/2012    Family History  Problem Relation Age of Onset   Breast cancer Neg Hx    Colon cancer Neg Hx    Colon polyps Neg Hx    Esophageal cancer Neg Hx    Rectal cancer Neg Hx    Stomach cancer Neg Hx      Physical Exam: General:   Alert,  well-nourished, pleasant and cooperative in NAD Head:  Normocephalic and atraumatic. Eyes:  Sclera clear, no icterus.   Conjunctiva pink. Mouth:  No deformity or lesions.   Neck:  Supple; no masses or thyromegaly. Lungs:  Clear throughout to auscultation.   No wheezes. Heart:  Regular rate and rhythm; no murmurs. Abdomen:  Soft, non-tender, nondistended, normal bowel sounds, no rebound or guarding.  Msk:  Symmetrical. No boney deformities LAD: No inguinal or umbilical LAD Extremities:  No clubbing or edema. Neurologic:  Alert and  oriented x4;  grossly  nonfocal Skin:  No obvious rash or bruise. Psych:  Alert and cooperative. Normal mood and affect.     Studies/Results: No results found.    Bhavya Eschete L. Tarri Glenn, MD, MPH 03/09/2023, 2:44 PM

## 2023-03-09 NOTE — Patient Instructions (Addendum)
Handouts and attachments on hemorrhoids and polyps given to patient. Await pathology results. Resume previous diet and continue present medications. Repeat colonoscopy for surveillance will be determined based off of pathology results.   YOU HAD AN ENDOSCOPIC PROCEDURE TODAY AT [facility]:   Refer to the procedure report that was given to you for any specific questions about what was found during the examination.  If the procedure report does not answer your questions, please call your gastroenterologist to clarify.  If you requested that your care partner not be given the details of your procedure findings, then the procedure report has been included in a sealed envelope for you to review at your convenience later.  YOU SHOULD EXPECT: Some feelings of bloating in the abdomen. Passage of more gas than usual.  Walking can help get rid of the air that was put into your GI tract during the procedure and reduce the bloating. If you had a lower endoscopy (such as a colonoscopy or flexible sigmoidoscopy) you may notice spotting of blood in your stool or on the toilet paper. If you underwent a bowel prep for your procedure, you may not have a normal bowel movement for a few days.  Please Note:  You might notice some irritation and congestion in your nose or some drainage.  This is from the oxygen used during your procedure.  There is no need for concern and it should clear up in a day or so.  SYMPTOMS TO REPORT IMMEDIATELY:  Following lower endoscopy (colonoscopy or flexible sigmoidoscopy):  Excessive amounts of blood in the stool  Significant tenderness or worsening of abdominal pains  Swelling of the abdomen that is new, acute  Fever of 100F or higher  For urgent or emergent issues, a gastroenterologist can be reached at any hour by calling [phone]. Do not use MyChart messaging for urgent concerns.    DIET:  We do recommend a small meal at first, but then you may proceed  to your regular diet.  Drink plenty of fluids but you should avoid alcoholic beverages for 24 hours.  ACTIVITY:  You should plan to take it easy for the rest of today and you should NOT DRIVE or use heavy machinery until tomorrow (because of the sedation medicines used during the test).    FOLLOW UP: Our staff will call the number listed on your records the next business day following your procedure.  We will call around 7:15- 8:00 am to check on you and address any questions or concerns that you may have regarding the information given to you following your procedure. If we do not reach you, we will leave a message.     If any biopsies were taken you will be contacted by phone or by letter within the next 1-3 weeks.  Please call us at [phone] if you have not heard about the biopsies in 3 weeks.    SIGNATURES/CONFIDENTIALITY: You and/or your care partner have signed paperwork which will be entered into your electronic medical record.  These signatures attest to the fact that that the information above on your After Visit Summary has been reviewed and is understood.  Full responsibility of the confidentiality of this discharge information lies with you and/or your care-partner.  HM NAY QU V? ? TH?C HI?N TH? THU?T N?I SOI T?I TRUNG TM N?I SOI [place]: Vui lng xem bo co th? thu?t ? ???c g?i cho qu v?, n?u qu v? c b?t k? th?c m?c g trong  su?t qu trnh th?m khm. N?u bo co th? thu?t khng th? gi?i ?p th?c m?c c?a qu v?, vui lng g?i cho bc s? chuyn khoa tiu ha c?a qu v? ?? ???c gi?i ?p. N?u qu v? ? yu c?u khng cung c?p thng tin chi ti?t v? k?t qu? th? thu?t cho ??i tc ch?m Nectar c?a qu v?, th bo co th? thu?t s? ???c g?i trong m?t phong b ???c dn kn ?? qu v? xem khi thu?n ti?n.   QU V? C TH?: C?m gic ch??ng b?ng. Trung ti?n nhi?u h?n bnh th??ng. ?i b? c th? gip ??y ra ngoi khng kh ?i vo ???ng tiu ha trong khi th?c hi?n th? thu?t v gi?m ch??ng b?ng. N?u  qu v? ti?n hnh n?i soi d??i (nh? n?i soi ??i trng ho?c soi k?t trng xch-ma b?ng ?ng m?m), qu v? c th? th?y cc ch?m mu ? phn ho?c trn gi?y v? sinh. N?u qu v? ? lm s?ch ??i trng ?? th?c hi?n th? thu?t, qu v? c th? khng ?i ??i ti?n nh? bnh th??ng trong vi ngy.  Vui Lng L?u : Qu v? c th? b? kch ?ng v ngh?t m?i ho?c ch?y n??c m?i. Tnh tr?ng ny l do ?nh h??ng c?a vi?c th? bnh oxy trong qu trnh th?c hi?n th? thu?t. Qu v? khng c?n lo l?ng, tnh tr?ng ny s? bi?n m?t sau m?t ho?c vi ngy.   CC TRI?U CH?NG C?N BO CO NGAY  Sau khi th?c hi?n n?i soi d??i (n?i soi ??i trng ho?c soi k?t trng xch-ma b?ng ?ng m?m): Phn c nhi?u mu ?au b?ng d? d?i ho?c ngy cng t?ng Xu?t hi?n v?t s?ng b?ng m?i, c?p tnh S?t t? 100F tr? ln   Sau khi th?c hi?n n?i soi trn (EGD)  Nn ra mu ho?c ch?t mu c ph s?m Xu?t hi?n c?n ?au ng?c ho?c ?au d??i x??ng b? vai m?i Nu?t ?au ho?c kh nu?t M?i b? kh th? S?t t? 100F tr? ln Phn ?en nh? m?c  ??i v?i cc v?n ?? kh?n c?p ho?c c?p c?u, qu v? c th? lin h? bc s? chuyn khoa tiu ha b?t k? lc no b?ng cch g?i ??n s? [id number].   CH? ?? ?N U?NG: Chng ti [employer] v? tr??c tin nn ?n nh?, nh?ng sau ? qu v? c th? ?n theo ch? ?? bnh th??ng. U?ng nhi?u n??c nh?ng ph?i trnh ?? u?ng c c?n trong 24 gi?.  HO?T ??NG: Qu v? c?n ln k? ho?ch ?? ngh? ng?i trong ngy hm nay v KHNG NN LI XE ho?c s? d?ng my mc n?ng cho ??n ngy mai (do tc d?ng c?a thu?c an th?n s? d?ng trong th? thu?t).   THEO DI: Nhn vin c?a chng ti s? g?i cho qu v? theo s? ?i?n tho?i trong b?nh n vo ngy lm vi?c ti?p theo sau ngy th?c hi?n th? thu?t ?? ki?m tra tnh tr?ng c?a qu v? v gi?i ?p cc cu h?i ho?c th?c m?c c?a qu v? v? thng tin m qu v? ???c cung c?p sau khi th?c hi?n th? thu?t. N?u chng ti khng lin l?c ???c v?i qu v?, chng ti s? ?? l?i tin nh?n. Tuy nhin, n?u qu v? c?m th?y kh?e v khng g?p b?t k? s? c? no, qu v? khng c?n  g?i l?i cho chng ti. Chng ti s? gi? ??nh r?ng qu v? ? tr? l?i sinh ho?t bnh th??ng v khng g?p b?t k? s? c?  no.  N?u qu v? ???c l?y sinh thi?t, chng ti s? lin l?c v?i qu v? qua ?i?n tho?i ho?c th? trong 1-3 tu?n ti?p theo. Vui lng g?i cho chng ti theo s? [phone] n?u qu v? khng nh?n ???c thng tin v? k?t qu? sinh thi?t trong 3 tu?n.  CH? K/B?O M?TSander Nephew v? v/ho?c ??i tc ch?m [place] c?a qu v? ? k vo cc ti li?u s? ???c nh?p vo h? s? y t? ?i?n t? c?a qu v?. Ch? k ny xc nh?n r?ng cc thng tin trn ?y trong B?n Tm T?t Sau Khi Th?m Khm c?a qu v? ? ???c xem xt v hi?u r. Qu v? v/ho?c

## 2023-03-10 ENCOUNTER — Telehealth: Payer: Self-pay | Admitting: *Deleted

## 2023-03-10 NOTE — Telephone Encounter (Signed)
  Follow up Call-     03/09/2023    2:42 PM  Call back number  Post procedure Call Back phone  # [phone]  Permission to leave phone message Yes     Patient questions:  Message left to call us if necessary.

## 2023-03-19 ENCOUNTER — Encounter: Payer: Self-pay | Admitting: Gastroenterology

## 2023-04-07 ENCOUNTER — Other Ambulatory Visit: Payer: Self-pay | Admitting: Family Medicine

## 2023-04-07 DIAGNOSIS — Z1231 Encounter for screening mammogram for malignant neoplasm of breast: Secondary | ICD-10-CM

## 2023-04-10 ENCOUNTER — Encounter: Payer: BC Managed Care – PPO | Admitting: Gastroenterology

## 2023-04-22 DIAGNOSIS — N644 Mastodynia: Secondary | ICD-10-CM | POA: Diagnosis not present

## 2023-04-24 DIAGNOSIS — N6012 Diffuse cystic mastopathy of left breast: Secondary | ICD-10-CM | POA: Diagnosis not present

## 2023-04-24 DIAGNOSIS — N6002 Solitary cyst of left breast: Secondary | ICD-10-CM | POA: Diagnosis not present

## 2023-04-24 DIAGNOSIS — R92343 Mammographic extreme density, bilateral breasts: Secondary | ICD-10-CM | POA: Diagnosis not present

## 2023-04-24 DIAGNOSIS — N6310 Unspecified lump in the right breast, unspecified quadrant: Secondary | ICD-10-CM | POA: Diagnosis not present

## 2023-04-24 DIAGNOSIS — N6320 Unspecified lump in the left breast, unspecified quadrant: Secondary | ICD-10-CM | POA: Diagnosis not present

## 2023-04-24 DIAGNOSIS — N6001 Solitary cyst of right breast: Secondary | ICD-10-CM | POA: Diagnosis not present

## 2023-05-20 ENCOUNTER — Ambulatory Visit: Payer: BC Managed Care – PPO

## 2023-06-12 DIAGNOSIS — L237 Allergic contact dermatitis due to plants, except food: Secondary | ICD-10-CM | POA: Diagnosis not present
